# Patient Record
Sex: Female | Born: 2000
Health system: Southern US, Community
[De-identification: ages and names within clinical notes are randomized; demographics above are authoritative.]

## PROBLEM LIST (undated history)

## (undated) ENCOUNTER — Inpatient Hospital Stay: Payer: Self-pay

## (undated) HISTORY — PX: NO PAST SURGERIES: SHX2092

---
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
## 2020-01-11 ENCOUNTER — Ambulatory Visit: Payer: Self-pay | Admitting: Cardiovascular Disease

## 2020-01-11 NOTE — Progress Notes (Deleted)
    Cardiology Office Note   Date:  01/11/2020   ID:  Brenda Medina, DOB 2001-05-10, MRN [id number]  PCP:  [name], MD  Cardiologist:  Lorine Bears, MD   No chief complaint on file.     History of Present Illness: Brenda Medina is a 19 y.o. female who presents for ***    No past medical history on file.  No past surgical history on file.   Current Outpatient Medications  Medication Sig Dispense Refill  . Norgestimate-Ethinyl Estradiol Triphasic (ORTHO TRI-CYCLEN LO) 0.18/0.215/0.25 MG-25 MCG tab Take 1 tablet by mouth daily. 1 Package 11   No current facility-administered medications for this visit.    Allergies:   Patient has no known allergies.    Social History:  The patient  reports that she has never smoked. She has never used smokeless tobacco. She reports that she does not drink alcohol or use drugs.   Family History:  The patient's ***family history includes Cancer in her mother; Heart disease in her father.    ROS:  Please see the history of present illness.   Otherwise, review of systems are positive for {NONE DEFAULTED:18576::"none"}.   All other systems are reviewed and negative.    PHYSICAL EXAM: VS:  There were no vitals taken for this visit. , BMI There is no height or weight on file to calculate BMI. GEN: Well nourished, well developed, in no acute distress  HEENT: normal  Neck: no JVD, carotid bruits, or masses Cardiac: ***RRR; no murmurs, rubs, or gallops,no edema  Respiratory:  clear to auscultation bilaterally, normal work of breathing GI: soft, nontender, nondistended, + BS MS: no deformity or atrophy  Skin: warm and dry, no rash Neuro:  Strength and sensation are intact Psych: euthymic mood, full affect   EKG:  EKG {ACTION; IS/IS WER:15400867} ordered today. The ekg ordered today demonstrates ***   Recent Labs: No results found for requested labs within last 8760 hours.    Lipid Panel No results found for: CHOL, TRIG,  HDL, CHOLHDL, VLDL, LDLCALC, LDLDIRECT    Wt Readings from Last 3 Encounters:  06/27/19 126 lb (57.2 kg) (52 %, Z= 0.05)*   * Growth percentiles are based on CDC (Girls, 2-20 Years) data.      Other studies Reviewed: Additional studies/ records that were reviewed today include: ***. Review of the above records demonstrates: ***  No flowsheet data found.    ASSESSMENT AND PLAN:  1.  ***    Disposition:   FU with *** in {gen number 6-19:509326} {Days to years:10300}  Signed,  Lorine Bears, MD  01/11/2020 8:24 AM    [facility]

## 2020-01-12 ENCOUNTER — Encounter: Payer: Self-pay | Admitting: Cardiovascular Disease

## 2020-01-12 DIAGNOSIS — Z20828 Contact with and (suspected) exposure to other viral communicable diseases: Secondary | ICD-10-CM | POA: Diagnosis not present

## 2020-01-16 DIAGNOSIS — Z20828 Contact with and (suspected) exposure to other viral communicable diseases: Secondary | ICD-10-CM | POA: Diagnosis not present

## 2020-01-16 DIAGNOSIS — Z03818 Encounter for observation for suspected exposure to other biological agents ruled out: Secondary | ICD-10-CM | POA: Diagnosis not present

## 2020-03-28 ENCOUNTER — Other Ambulatory Visit: Payer: Self-pay | Admitting: Family Medicine

## 2020-03-28 DIAGNOSIS — L700 Acne vulgaris: Secondary | ICD-10-CM

## 2020-03-28 DIAGNOSIS — Z30011 Encounter for initial prescription of contraceptive pills: Secondary | ICD-10-CM

## 2020-04-19 ENCOUNTER — Ambulatory Visit (INDEPENDENT_AMBULATORY_CARE_PROVIDER_SITE_OTHER): Payer: BC Managed Care – PPO | Admitting: Family Medicine

## 2020-04-19 ENCOUNTER — Encounter: Payer: Self-pay | Admitting: Family Medicine

## 2020-04-19 VITALS — BP 110/70 | HR 80 | Ht 66.0 in | Wt 128.0 lb

## 2020-04-19 DIAGNOSIS — N926 Irregular menstruation, unspecified: Secondary | ICD-10-CM

## 2020-04-19 LAB — POCT URINALYSIS DIPSTICK
Bilirubin, UA: NEGATIVE
Glucose, UA: NEGATIVE
Ketones, UA: NEGATIVE
Leukocytes, UA: NEGATIVE
Nitrite, UA: NEGATIVE
Protein, UA: NEGATIVE
Spec Grav, UA: 1.02 (ref 1.010–1.025)
Urobilinogen, UA: 0.2 E.U./dL
pH, UA: 5 (ref 5.0–8.0)

## 2020-04-19 LAB — POCT URINE PREGNANCY: Preg Test, Ur: NEGATIVE

## 2020-04-19 NOTE — Progress Notes (Signed)
Date:  04/19/2020   Name:  Brenda Medina   DOB:  [birth year]   MRN:  [id number]   Chief Complaint: Menstrual Problem (referral to obgyn) and Possible Pregnancy (to rule out)  Possible Pregnancy This is a new problem. The current episode started 1 to 4 weeks ago. The problem occurs daily. The problem has been waxing and waning. Pertinent negatives include no abdominal pain, anorexia, arthralgias, change in bowel habit, chest pain, chills, congestion, coughing, diaphoresis, fatigue, fever, headaches, joint swelling, myalgias, nausea, neck pain, numbness, rash, sore throat, swollen glands, urinary symptoms, vertigo, visual change, vomiting or weakness. Nothing aggravates the symptoms.    No results found for: CREATININE, BUN, NA, K, CL, CO2 No results found for: CHOL, HDL, LDLCALC, LDLDIRECT, TRIG, CHOLHDL No results found for: TSH No results found for: HGBA1C No results found for: WBC, HGB, HCT, MCV, PLT No results found for: ALT, AST, GGT, ALKPHOS, BILITOT   Review of Systems  Constitutional: Negative.  Negative for chills, diaphoresis, fatigue, fever and unexpected weight change.  HENT: Negative for congestion, ear discharge, ear pain, rhinorrhea, sinus pressure, sneezing and sore throat.   Eyes: Negative for photophobia, pain, discharge, redness and itching.  Respiratory: Negative for cough, shortness of breath, wheezing and stridor.   Cardiovascular: Negative for chest pain.  Gastrointestinal: Negative for abdominal pain, anorexia, blood in stool, change in bowel habit, constipation, diarrhea, nausea and vomiting.  Endocrine: Negative for cold intolerance, heat intolerance, polydipsia, polyphagia and polyuria.  Genitourinary: Negative for dysuria, flank pain, frequency, hematuria, menstrual problem, pelvic pain, urgency, vaginal bleeding and vaginal discharge.  Musculoskeletal: Negative for arthralgias, back pain, joint swelling, myalgias and neck pain.  Skin: Negative for rash.    Allergic/Immunologic: Negative for environmental allergies and food allergies.  Neurological: Negative for dizziness, vertigo, weakness, light-headedness, numbness and headaches.  Hematological: Negative for adenopathy. Does not bruise/bleed easily.  Psychiatric/Behavioral: Negative for dysphoric mood. The patient is not nervous/anxious.     There are no problems to display for this patient.   No Known Allergies  No past surgical history on file.  Social History   Tobacco Use  . Smoking status: Never Smoker  . Smokeless tobacco: Never Used  Substance Use Topics  . Alcohol use: Never  . Drug use: Never     Medication list has been reviewed and updated.  Current Meds  Medication Sig  . TRI-LO-MARZIA 0.18/0.215/0.25 MG-25 MCG tab TAKE 1 TABLET BY MOUTH EVERY DAY    PHQ 2/9 Scores 04/19/2020 06/27/2019  PHQ - 2 Score 0 0  PHQ- 9 Score 0 -    BP Readings from Last 3 Encounters:  04/19/20 110/70  06/27/19 100/62    Physical Exam Vitals and nursing note reviewed.  Constitutional:      Appearance: She is well-developed.  HENT:     Head: Normocephalic.     Right Ear: Tympanic membrane, ear canal and external ear normal.     Left Ear: Tympanic membrane, ear canal and external ear normal.     Nose: Nose normal.     Mouth/Throat:     Mouth: Mucous membranes are moist.  Eyes:     General: Lids are everted, no foreign bodies appreciated. No scleral icterus.       Left eye: No foreign body or hordeolum.     Conjunctiva/sclera: Conjunctivae normal.     Right eye: Right conjunctiva is not injected.     Left eye: Left conjunctiva is not injected.  Pupils: Pupils are equal, round, and reactive to light.  Neck:     Thyroid: No thyromegaly.     Vascular: No JVD.     Trachea: No tracheal deviation.  Cardiovascular:     Rate and Rhythm: Normal rate and regular rhythm.     Heart sounds: Normal heart sounds. No murmur. No friction rub. No gallop.   Pulmonary:     Effort:  Pulmonary effort is normal. No respiratory distress.     Breath sounds: Normal breath sounds. No wheezing, rhonchi or rales.  Abdominal:     General: Bowel sounds are normal.     Palpations: Abdomen is soft. There is no mass.     Tenderness: There is no abdominal tenderness. There is no guarding or rebound.     Hernia: No hernia is present.  Musculoskeletal:        General: No tenderness. Normal range of motion.     Cervical back: Normal range of motion and neck supple.  Lymphadenopathy:     Cervical: No cervical adenopathy.  Skin:    General: Skin is warm.     Findings: No rash.  Neurological:     Mental Status: She is alert and oriented to person, place, and time.     Cranial Nerves: No cranial nerve deficit.     Motor: No weakness.     Deep Tendon Reflexes: Reflexes normal.  Psychiatric:        Mood and Affect: Mood is not anxious or depressed.     Wt Readings from Last 3 Encounters:  04/19/20 128 lb (58.1 kg) (52 %, Z= 0.04)*  06/27/19 126 lb (57.2 kg) (52 %, Z= 0.05)*   * Growth percentiles are based on CDC (Girls, 2-20 Years) data.    BP 110/70   Pulse 80   Ht 5\' 6"  (1.676 m)   Wt 128 lb (58.1 kg)   LMP 04/15/2020 (Approximate) Comment: irregular/ spotting  BMI 20.66 kg/m   Assessment and Plan:  1. Irregular periods Patient is currently on birth control with OCP.  Patient had a delay of one of her periods of about 2 weeks after which she did pass more mucus and blood than usual.  There was no tissue that was noted.  Patient was concerned about the possibility of pregnancy and the pregnancy test was done and that was negative.  Patient desires to have GYN follow-up for maintenance and discussion about possibility of cessation of birth control for pregnancy concerns.  Referral was made to Dr. 04/17/2020 for evaluation and further treatment. - Ambulatory referral to Obstetrics / Gynecology

## 2020-04-21 ENCOUNTER — Other Ambulatory Visit: Payer: Self-pay | Admitting: Family Medicine

## 2020-04-21 DIAGNOSIS — Z30011 Encounter for initial prescription of contraceptive pills: Secondary | ICD-10-CM

## 2020-04-21 DIAGNOSIS — L700 Acne vulgaris: Secondary | ICD-10-CM

## 2020-04-21 NOTE — Telephone Encounter (Signed)
Requested medication (s) are due for refill today: yes  Requested medication (s) are on the active medication list: yes  Last refill:  03/28/20  Future visit scheduled: with OB/GYN tomorrow  Notes to clinic:  Patient desires to have GYN follow-up for maintenance and discussion about possibility of cessation of birth control for pregnancy concerns.  Referral was made to Dr. Joan Flores for evaluation and further treatment. - Ambulatory referral to Obstetrics / Gynecology    Requested Prescriptions  Pending Prescriptions Disp Refills   TRI-LO-MARZIA 0.18/0.215/0.25 MG-25 MCG tab [Pharmacy Med Name: TRI-LO-MARZIA TABLET] 28 tablet 0    Sig: TAKE 1 TABLET BY MOUTH EVERY DAY      OB/GYN:  Contraceptives Passed - 04/21/2020 11:31 AM      Passed - Last BP in normal range    BP Readings from Last 1 Encounters:  04/19/20 110/70          Passed - Valid encounter within last 12 months    Recent Outpatient Visits           2 days ago Irregular periods   [facility] [name], MD   9 months ago Establishing care with new doctor, encounter for   [facility] [name], MD

## 2020-05-16 ENCOUNTER — Other Ambulatory Visit: Payer: Self-pay | Admitting: Family Medicine

## 2020-05-16 DIAGNOSIS — L700 Acne vulgaris: Secondary | ICD-10-CM

## 2020-05-16 DIAGNOSIS — Z30011 Encounter for initial prescription of contraceptive pills: Secondary | ICD-10-CM

## 2020-05-30 ENCOUNTER — Other Ambulatory Visit: Payer: Self-pay | Admitting: Family Medicine

## 2020-05-30 DIAGNOSIS — L700 Acne vulgaris: Secondary | ICD-10-CM

## 2020-05-30 DIAGNOSIS — Z30011 Encounter for initial prescription of contraceptive pills: Secondary | ICD-10-CM

## 2020-08-31 ENCOUNTER — Other Ambulatory Visit: Payer: Self-pay | Admitting: Family Medicine

## 2020-08-31 DIAGNOSIS — Z30011 Encounter for initial prescription of contraceptive pills: Secondary | ICD-10-CM

## 2020-08-31 DIAGNOSIS — L700 Acne vulgaris: Secondary | ICD-10-CM

## 2020-08-31 NOTE — Telephone Encounter (Signed)
Requested Prescriptions  Pending Prescriptions Disp Refills   TRI-LO-MARZIA 0.18/0.215/0.25 MG-25 MCG tab [Pharmacy Med Name: TRI-LO-MARZIA TABLET] 28 tablet 2    Sig: TAKE 1 TABLET BY MOUTH EVERY DAY     OB/GYN:  Contraceptives Passed - 08/31/2020  8:56 AM      Passed - Last BP in normal range    BP Readings from Last 1 Encounters:  04/19/20 110/70         Passed - Valid encounter within last 12 months    Recent Outpatient Visits          4 months ago Irregular periods   [facility] [name], MD   1 year ago Establishing care with new doctor, encounter for   [facility] [name], MD

## 2020-11-08 ENCOUNTER — Other Ambulatory Visit: Payer: Self-pay | Admitting: Family Medicine

## 2020-11-08 DIAGNOSIS — Z30011 Encounter for initial prescription of contraceptive pills: Secondary | ICD-10-CM

## 2020-11-08 DIAGNOSIS — L700 Acne vulgaris: Secondary | ICD-10-CM

## 2021-02-12 ENCOUNTER — Other Ambulatory Visit: Payer: Self-pay | Admitting: Family Medicine

## 2021-02-12 DIAGNOSIS — Z30011 Encounter for initial prescription of contraceptive pills: Secondary | ICD-10-CM

## 2021-02-12 DIAGNOSIS — L700 Acne vulgaris: Secondary | ICD-10-CM

## 2021-04-21 ENCOUNTER — Other Ambulatory Visit: Payer: Self-pay

## 2021-04-21 ENCOUNTER — Telehealth: Payer: Self-pay | Admitting: Family Medicine

## 2021-04-21 DIAGNOSIS — Z30011 Encounter for initial prescription of contraceptive pills: Secondary | ICD-10-CM

## 2021-04-21 DIAGNOSIS — L700 Acne vulgaris: Secondary | ICD-10-CM

## 2021-04-21 NOTE — Telephone Encounter (Signed)
Have not seen pt since April 2021- needs appt this week or next

## 2021-04-21 NOTE — Telephone Encounter (Signed)
Pt calling stating that she tried to have her birth control, tri lo Annia Belt, due to her insurance. She states that the pharmacy is refusing to give it to her. Please advise.      [id number] - [place], [place] - [address]  [address] [place] [address]  Phone: [phone] Fax: [phone]  Hours: Not open 24 hours

## 2021-04-21 NOTE — Telephone Encounter (Signed)
Patient is wet up for next Friday the 13th for med refill

## 2021-05-02 ENCOUNTER — Encounter: Payer: Self-pay | Admitting: Family Medicine

## 2021-05-02 ENCOUNTER — Other Ambulatory Visit: Payer: Self-pay

## 2021-05-02 ENCOUNTER — Ambulatory Visit (INDEPENDENT_AMBULATORY_CARE_PROVIDER_SITE_OTHER): Payer: BC Managed Care – PPO | Admitting: Family Medicine

## 2021-05-02 VITALS — BP 120/62 | HR 80 | Ht 66.0 in | Wt 131.0 lb

## 2021-05-02 DIAGNOSIS — Z3041 Encounter for surveillance of contraceptive pills: Secondary | ICD-10-CM

## 2021-05-02 DIAGNOSIS — N921 Excessive and frequent menstruation with irregular cycle: Secondary | ICD-10-CM

## 2021-05-02 DIAGNOSIS — L700 Acne vulgaris: Secondary | ICD-10-CM | POA: Diagnosis not present

## 2021-05-02 MED ORDER — NORGESTIM-ETH ESTRAD TRIPHASIC 0.18/0.215/0.25 MG-25 MCG PO TABS: 1.0000 | ORAL_TABLET | Freq: Every day | ORAL | 3 refills | Status: DC

## 2021-05-02 NOTE — Patient Instructions (Signed)
Oral Contraception Information Oral contraceptive pills (OCPs) are medicines taken by mouth to prevent pregnancy. They work by:  Preventing the ovaries from releasing eggs.  Thickening mucus in the lower part of the uterus (cervix). This prevents sperm from entering the uterus.  Thinning the lining of the uterus (endometrium). This prevents a fertilized egg from attaching to the endometrium. OCPs are highly effective when taken exactly as prescribed. However, OCPs do not prevent STIs (sexually transmitted infections). Using condoms while on an OCP can help prevent STIs. What happens before starting OCPs? Before you start taking OCPs:  You may have a physical exam, blood test, and Pap test.  Your health care provider will make sure you are a good candidate for oral contraception. OCPs are not a good option for certain women, such as: ? Women who smoke and are older than age 35. ? Women who have or have had certain conditions, such as:  A history of high blood pressure.  Deep vein thrombosis.  Pulmonary embolism.  Stroke.  Cardiovascular disease.  Peripheral vascular disease. Ask your health care provider about the possible side effects of the OCP you may be prescribed. Be aware that it can take 2-3 months for your body to adjust to changes in hormone levels. Types of oral contraception Birth control pills contain the hormones estrogen and progestin (synthetic progesterone) or progestin only. The combination pill This type of pill contains estrogen and progestin hormones.  Conventional contraception pills come in packs of 21 or 28 pills. ? Some packs with 28-day pills contain estrogen and progestin for the first 21-24 days. Hormone-free tablets, called placebos, are taken for the final 4-7 days. You should have menstrual bleeding during the time you take the placebos. ? In packs with 21 tablets, you take no pills for 7 days. Menstrual bleeding occurs during these days. (Some people  prefer taking a pill for 28 days to help establish a routine).  Extended-interval contraception pills come in packs of 91 pills. The first 84 tablets have both estrogen and progestin. The last 7 pills are placebos. Menstrual bleeding occurs during the placebo days. With this schedule, menstrual bleeding happens once every 3 months.  Continuous contraception pills come in packs of 28 pills. All pills in the pack contain estrogen and progestin. With this schedule, regular menstrual bleeding does not happen, but there may be spotting or irregular bleeding. Progestin-only pills This type of pill is often called the mini-pill and contains the progestin hormone only. It comes in packs of 28 pills. In some packs, the last 4 pills are placebos. The pill must be taken at the same time every day. This is very important to prevent pregnancy. Menstrual bleeding may not be regular or predictable.   What are the advantages? Oral contraception provides reliable and continuous contraception if taken as directed. It may treat or decrease symptoms of:  Menstrual period cramps.  Irregular menstrual cycle or bleeding.  Heavy menstrual flow.  Abnormal uterine bleeding.  Acne, depending on the type of pill.  Polycystic ovarian syndrome (POS).  Endometriosis.  Iron deficiency anemia.  Premenstrual symptoms, including severe irritability, depression, or anxiety. It also may:  Reduce the risk of endometrial and ovarian cancer.  Be used as emergency contraception.  Prevent ectopic pregnancies and infections of the fallopian tubes. What can make OCPs less effective? OCPs may be less effective if:  You forget to take the pill every day. For progestin-only pills, it is especially important to take the pill at the   same time each day. Even taking it 3 hours late can increase the risk of pregnancy.  You have a stomach or intestinal disease that reduces your body's ability to absorb the pill.  You take OCPs  with other medicines that make OCPs less effective, such as antibiotics, certain HIV medicines, and some seizure medicines.  You take expired OCPs.  You forget to restart the pill after 7 days of not taking it. This refers to the packs of 21 pills. What are the side effects and risks? OCPs can sometimes cause side effects, such as:  Headache.  Depression.  Trouble sleeping.  Nausea and vomiting.  Breast tenderness.  Irregular bleeding or spotting during the first several months.  Bloating or fluid retention.  Increase in blood pressure. Combination pills may slightly increase the risk of:  Blood clots.  Heart attack.  Stroke. Follow these instructions at home: Follow instructions from your health care provider about how to start taking your first cycle of OCPs. Depending on when you start the pill, you may need to use a backup form of birth control, such as condoms, during the first week. Make sure you know what steps to take if you forget to take the pill. Summary  Oral contraceptive pills (OCPs) are medicines taken by mouth to prevent pregnancy. They are highly effective when taken exactly as prescribed.  OCPs contain a combination of the hormones estrogen and progestin (synthetic progesterone) or progestin only.  Before you start taking the pill, you may have a physical exam, blood test, and Pap test. Your health care provider will make sure you are a good candidate for oral contraception.  The combination pill may come in a 21-day pack, a 28-day pack, or a 91-day pack. Progestin-only pills come in packs of 28 pills.  OCPs can sometimes cause side effects, such as headache, nausea, breast tenderness, or irregular bleeding. This information is not intended to replace advice given to you by your health care provider. Make sure you discuss any questions you have with your health care provider. Document Revised: 09/06/2020 Document Reviewed: 08/15/2020 Elsevier Patient  Education  2021 [employer]vier Inc.  

## 2021-05-02 NOTE — Progress Notes (Signed)
Date:  05/02/2021   Name:  Brenda Medina   DOB:  2001-07-08   MRN:  [id number]   Chief Complaint: Contraception  Patient is a 20 year old female who presents for a comprehensive physical exam. The patient reports the following problems: none. Health maintenance has been reviewed up to date.   No results found for: CREATININE, BUN, NA, K, CL, CO2 No results found for: CHOL, HDL, LDLCALC, LDLDIRECT, TRIG, CHOLHDL No results found for: TSH No results found for: HGBA1C No results found for: WBC, HGB, HCT, MCV, PLT No results found for: ALT, AST, GGT, ALKPHOS, BILITOT   Review of Systems  Constitutional: Negative.  Negative for chills, fatigue, fever and unexpected weight change.  HENT: Negative for congestion, ear discharge, ear pain, rhinorrhea, sinus pressure, sneezing and sore throat.   Eyes: Negative for photophobia, pain, discharge, redness and itching.  Respiratory: Negative for cough, shortness of breath, wheezing and stridor.   Gastrointestinal: Negative for abdominal pain, blood in stool, constipation, diarrhea, nausea and vomiting.  Endocrine: Negative for cold intolerance, heat intolerance, polydipsia, polyphagia and polyuria.  Genitourinary: Negative for dysuria, flank pain, frequency, hematuria, menstrual problem, pelvic pain, urgency, vaginal bleeding and vaginal discharge.  Musculoskeletal: Negative for arthralgias, back pain and myalgias.  Skin: Negative for rash.  Allergic/Immunologic: Negative for environmental allergies and food allergies.  Neurological: Negative for dizziness, weakness, light-headedness, numbness and headaches.  Hematological: Negative for adenopathy. Does not bruise/bleed easily.  Psychiatric/Behavioral: Negative for dysphoric mood. The patient is not nervous/anxious.     There are no problems to display for this patient.   No Known Allergies  No past surgical history on file.  Social History   Tobacco Use  . Smoking status: Never Smoker   . Smokeless tobacco: Never Used  Substance Use Topics  . Alcohol use: Never  . Drug use: Never     Medication list has been reviewed and updated.  Current Meds  Medication Sig  . TRI-LO-MARZIA 0.18/0.215/0.25 MG-25 MCG tab TAKE 1 TABLET BY MOUTH EVERY DAY    PHQ 2/9 Scores 05/02/2021 04/19/2020 06/27/2019  PHQ - 2 Score 0 0 0  PHQ- 9 Score 0 0 -    GAD 7 : Generalized Anxiety Score 05/02/2021 04/19/2020  Nervous, Anxious, on Edge 0 0  Control/stop worrying 0 0  Worry too much - different things 0 0  Trouble relaxing 0 0  Restless 0 0  Easily annoyed or irritable 0 0  Afraid - awful might happen 0 0  Total GAD 7 Score 0 0    BP Readings from Last 3 Encounters:  05/02/21 120/62  04/19/20 110/70  06/27/19 100/62    Physical Exam Vitals and nursing note reviewed.  Constitutional:      Appearance: She is well-developed.  HENT:     Head: Normocephalic.     Right Ear: Tympanic membrane, ear canal and external ear normal. There is no impacted cerumen.     Left Ear: Tympanic membrane, ear canal and external ear normal. There is no impacted cerumen.  Eyes:     General: Lids are everted, no foreign bodies appreciated. No scleral icterus.       Left eye: No foreign body or hordeolum.     Conjunctiva/sclera: Conjunctivae normal.     Right eye: Right conjunctiva is not injected.     Left eye: Left conjunctiva is not injected.     Pupils: Pupils are equal, round, and reactive to light.  Neck:  Thyroid: No thyromegaly.     Vascular: No JVD.     Trachea: No tracheal deviation.  Cardiovascular:     Rate and Rhythm: Normal rate and regular rhythm.     Heart sounds: Normal heart sounds. No murmur heard. No friction rub. No gallop.   Pulmonary:     Effort: Pulmonary effort is normal. No respiratory distress.     Breath sounds: Normal breath sounds. No wheezing or rales.  Abdominal:     General: Bowel sounds are normal.     Palpations: Abdomen is soft. There is no mass.      Tenderness: There is no abdominal tenderness. There is no guarding or rebound.  Musculoskeletal:        General: No tenderness. Normal range of motion.     Cervical back: Normal range of motion and neck supple.  Lymphadenopathy:     Cervical: No cervical adenopathy.  Skin:    General: Skin is warm.     Findings: No rash.  Neurological:     Mental Status: She is alert and oriented to person, place, and time.     Cranial Nerves: No cranial nerve deficit.     Deep Tendon Reflexes: Reflexes normal.  Psychiatric:        Mood and Affect: Mood is not anxious or depressed.     Wt Readings from Last 3 Encounters:  05/02/21 131 lb (59.4 kg)  04/19/20 128 lb (58.1 kg) (52 %, Z= 0.04)*  06/27/19 126 lb (57.2 kg) (52 %, Z= 0.05)*   * Growth percentiles are based on CDC (Girls, 2-20 Years) data.    BP 120/62   Pulse 80   Ht 5\' 6"  (1.676 m)   Wt 131 lb (59.4 kg)   LMP 04/30/2021 (Approximate)   BMI 21.14 kg/m   Assessment and Plan: 1. Oral contraceptive pill surveillance .  Persistent.  Stable.  Patient has tolerated and would like to continue on current oral contraceptive pill selection.  Patient is doing well with no contraindications or concerns.  It was discussed with patient concerning the side effects and risks of OCPs and patient understands.  Patient prefers to continue on current dosing and patient will return in 1 year for likely Pap and refills. - Norgestimate-Ethinyl Estradiol Triphasic (TRI-LO-MARZIA) 0.18/0.215/0.25 MG-25 MCG tab; Take 1 tablet by mouth daily.  Dispense: 84 tablet; Refill: 3  2. Menorrhagia with irregular cycle Chronic.  Stable.  Corrected with OCPs.  Patient had previous history of menorrhagia with irregular cycles and OCPs have reduced bleeding to an acceptable amount and timing is monthly. - Norgestimate-Ethinyl Estradiol Triphasic (TRI-LO-MARZIA) 0.18/0.215/0.25 MG-25 MCG tab; Take 1 tablet by mouth daily.  Dispense: 84 tablet; Refill: 3  3. Acne  vulgaris Chronic.  Controlled.  Stable.  Previous history of acne vulgaris and patient has done well on current dosing of OCP for control of acneiform rash. - Norgestimate-Ethinyl Estradiol Triphasic (TRI-LO-MARZIA) 0.18/0.215/0.25 MG-25 MCG tab; Take 1 tablet by mouth daily.  Dispense: 84 tablet; Refill: 3

## 2021-05-05 ENCOUNTER — Other Ambulatory Visit: Payer: Self-pay

## 2021-05-05 ENCOUNTER — Telehealth: Payer: Self-pay | Admitting: Family Medicine

## 2021-05-05 DIAGNOSIS — L700 Acne vulgaris: Secondary | ICD-10-CM

## 2021-05-05 DIAGNOSIS — Z3041 Encounter for surveillance of contraceptive pills: Secondary | ICD-10-CM

## 2021-05-05 DIAGNOSIS — N921 Excessive and frequent menstruation with irregular cycle: Secondary | ICD-10-CM

## 2021-05-05 MED ORDER — NORGESTIM-ETH ESTRAD TRIPHASIC 0.18/0.215/0.25 MG-25 MCG PO TABS: 1.0000 | ORAL_TABLET | Freq: Every day | ORAL | 3 refills | Status: DC

## 2021-05-05 NOTE — Telephone Encounter (Signed)
Sent to [facility]

## 2021-05-05 NOTE — Telephone Encounter (Signed)
Pt called and is requesting to have her prescription, Tri-Lo-Marzia, sent to the [facility] pharmacy instead of [facility]. She states that her insurance is no longer covering medication through [facility]. Please advise.      [id number] [place], [place] - [address] AT [facility] [address] [place]  [address] [place] [address]  Phone: [phone] Fax: [phone]  Hours: Not open 24 hours

## 2021-09-27 ENCOUNTER — Emergency Department: Payer: BC Managed Care – PPO

## 2021-09-27 ENCOUNTER — Encounter: Payer: Self-pay | Admitting: Emergency Medicine

## 2021-09-27 ENCOUNTER — Other Ambulatory Visit: Payer: Self-pay

## 2021-09-27 ENCOUNTER — Emergency Department
Admission: EM | Admit: 2021-09-27 | Discharge: 2021-09-27 | Disposition: A | Discharge status: Home / Self Care | Payer: BC Managed Care – PPO | Attending: Emergency Medicine | Admitting: Emergency Medicine

## 2021-09-27 DIAGNOSIS — S3993XA Unspecified injury of pelvis, initial encounter: Secondary | ICD-10-CM | POA: Diagnosis not present

## 2021-09-27 DIAGNOSIS — M545 Low back pain, unspecified: Secondary | ICD-10-CM | POA: Diagnosis not present

## 2021-09-27 DIAGNOSIS — M25552 Pain in left hip: Secondary | ICD-10-CM | POA: Diagnosis not present

## 2021-09-27 DIAGNOSIS — S300XXA Contusion of lower back and pelvis, initial encounter: Secondary | ICD-10-CM | POA: Diagnosis not present

## 2021-09-27 DIAGNOSIS — M533 Sacrococcygeal disorders, not elsewhere classified: Secondary | ICD-10-CM | POA: Diagnosis not present

## 2021-09-27 LAB — POC URINE PREG, ED: Preg Test, Ur: NEGATIVE

## 2021-09-27 MED ORDER — MELOXICAM 15 MG PO TABS: 15.0000 mg | ORAL_TABLET | Freq: Every day | ORAL | 0 refills | Status: DC

## 2021-09-27 MED ORDER — ACETAMINOPHEN 325 MG PO TABS
650.0000 mg | ORAL_TABLET | Freq: Once | ORAL | Status: AC
  Administered 2021-09-27: 650 mg via ORAL
  Filled 2021-09-27: qty 2

## 2021-09-27 MED ORDER — METHOCARBAMOL 500 MG PO TABS: 500.0000 mg | ORAL_TABLET | Freq: Four times a day (QID) | ORAL | 0 refills | Status: DC

## 2021-09-27 MED ORDER — IBUPROFEN 600 MG PO TABS
600.0000 mg | ORAL_TABLET | Freq: Once | ORAL | Status: AC
  Administered 2021-09-27: 600 mg via ORAL
  Filled 2021-09-27: qty 1

## 2021-09-27 NOTE — ED Triage Notes (Addendum)
Pt via POV from home. Pt was riding her horse on the side of the road and she fell off landing on her L side. Pt c/o L hip, buttock, and sacral pain. Denies pain anywhere else. Pt is A&OX4 and NAD. Ambulatory to triage

## 2021-09-27 NOTE — ED Provider Notes (Signed)
[facility] Emergency Department Provider Note  ____________________________________________  Time seen: Approximately 2:39 PM  I have reviewed the triage vital signs and the nursing notes.   HISTORY  Chief Complaint Fall    HPI Brenda Medina is a 20 y.o. female who presents the emergency department complaining of left hip and tailbone pain.  Patient states that the pain is primarily in the region of the buttocks extending into the sacral region.  Patient did fall off of a horse but did not hit her head or lose consciousness.  She denies any back, chest, abdominal pain.  She denies any headache, neck pain.  No medications prior to arrival.  Patient is still ambulating at this time.       History reviewed. No pertinent past medical history.  There are no problems to display for this patient.   History reviewed. No pertinent surgical history.  Prior to Admission medications   Medication Sig Start Date End Date Taking? Authorizing Provider  meloxicam (MOBIC) 15 MG tablet Take 1 tablet (15 mg total) by mouth daily. 09/27/21  Yes Kadeja Granada, Delorise Royals, PA-C  methocarbamol (ROBAXIN) 500 MG tablet Take 1 tablet (500 mg total) by mouth 4 (four) times daily. 09/27/21  Yes Amia Rynders, Delorise Royals, PA-C  Norgestimate-Ethinyl Estradiol Triphasic (TRI-LO-MARZIA) 0.18/0.215/0.25 MG-25 MCG tab Take 1 tablet by mouth daily. 05/05/21   [name], MD    Allergies Patient has no known allergies.  Family History  Problem Relation Age of Onset   Cancer Mother    Heart disease Father     Social History Social History   Tobacco Use   Smoking status: Never   Smokeless tobacco: Never  Substance Use Topics   Alcohol use: Never   Drug use: Never     Review of Systems  Constitutional: No fever/chills Eyes: No visual changes. No discharge ENT: No upper respiratory complaints. Cardiovascular: no chest pain. Respiratory: no cough. No SOB. Gastrointestinal: No  abdominal pain.  No nausea, no vomiting.  No diarrhea.  No constipation. Musculoskeletal: Positive for left hip, sacral pain after a fall off of a horse Skin: Negative for rash, abrasions, lacerations, ecchymosis. Neurological: Negative for headaches, focal weakness or numbness.  10 System ROS otherwise negative.  ____________________________________________   PHYSICAL EXAM:  VITAL SIGNS: ED Triage Vitals  Enc Vitals Group     BP 09/27/21 1359 124/67     Pulse Rate 09/27/21 1359 93     Resp 09/27/21 1359 18     Temp 09/27/21 1359 98.1 F (36.7 C)     Temp Source 09/27/21 1359 Oral     SpO2 09/27/21 1359 100 %     Weight 09/27/21 1400 130 lb (59 kg)     Height 09/27/21 1400 5\' 8"  (1.727 m)     Head Circumference --      Peak Flow --      Pain Score 09/27/21 1359 8     Pain Loc --      Pain Edu? --      Excl. in GC? --      Constitutional: Alert and oriented. Well appearing and in no acute distress. Eyes: Conjunctivae are normal. PERRL. EOMI. Head: Atraumatic. ENT:      Ears:       Nose: No congestion/rhinnorhea.      Mouth/Throat: Mucous membranes are moist.  Neck: No stridor.    Cardiovascular: Normal rate, regular rhythm. Normal S1 and S2.  Good peripheral circulation. Respiratory: Normal respiratory  effort without tachypnea or retractions. Lungs CTAB. Good air entry to the bases with no decreased or absent breath sounds. Gastrointestinal: Bowel sounds 4 quadrants. Soft and nontender to palpation. No guarding or rigidity. No palpable masses. No distention. No CVA tenderness. Musculoskeletal: Full range of motion to all extremities. No gross deformities appreciated.  No obvious deformities to the hip, leg or lumbar spine.  Patient is tender over the sacral spine extending into the SI joint.  No palpable abnormalities in this region.  Sensation and pulses intact bilateral lower extremities. Neurologic:  Normal speech and language. No gross focal neurologic deficits are  appreciated.  Skin:  Skin is warm, dry and intact. No rash noted. Psychiatric: Mood and affect are normal. Speech and behavior are normal. Patient exhibits appropriate insight and judgement.   ____________________________________________   LABS (all labs ordered are listed, but only abnormal results are displayed)  Labs Reviewed  POC URINE PREG, ED   ____________________________________________  EKG   ____________________________________________  RADIOLOGY I personally viewed and evaluated these images as part of my medical decision making, as well as reviewing the written report by the radiologist.  ED Provider Interpretation: No acute traumatic findings.  Remote injury identified on lumbar spine  DG Lumbar Spine 2-3 Views  Result Date: 09/27/2021 CLINICAL DATA:  fall off horse, L hip and sacral pain EXAM: LUMBAR SPINE - 2-3 VIEW COMPARISON:  None. FINDINGS: This report assumes 5 non rib-bearing lumbar vertebrae. Lumbar vertebral body heights are preserved, with no acute fracture. Well corticated limbus vertebra in the anterior inferior L4 vertebral body. Mild loss of disc height at L2-3 without significant degenerative changes. Otherwise preserved lumbar discs. No spondylolisthesis. No appreciable facet arthropathy. No aggressive appearing focal osseous lesions. IMPRESSION: 1. No acute lumbar spine fracture or spondylolisthesis. 2. Mild loss of disc height at L2-3. 3. Well corticated limbus vertebrae in the anterior inferior L4 vertebral body compatible with remote injury. Electronically Signed   By: Delbert Phenix M.D.   On: 09/27/2021 15:13   DG Sacrum/Coccyx  Result Date: 09/27/2021 CLINICAL DATA:  fall off horse, L hip and sacral pain EXAM: SACRUM AND COCCYX - 2+ VIEW COMPARISON:  None. FINDINGS: There is no evidence of fracture or other focal bone lesions. IMPRESSION: No sacrococcygeal fracture. Electronically Signed   By: Delbert Phenix M.D.   On: 09/27/2021 15:15   DG Hip Unilat  W or Wo Pelvis 2-3 Views Left  Result Date: 09/27/2021 CLINICAL DATA:  fall off horse, L hip and sacral pain EXAM: DG HIP (WITH OR WITHOUT PELVIS) 2-3V LEFT COMPARISON:  None. FINDINGS: No pelvic fracture or diastasis. No left hip fracture or dislocation. No suspicious focal osseous lesions. No significant left hip arthropathy. No radiopaque foreign bodies. IMPRESSION: No fracture.  No left hip malalignment. Electronically Signed   By: Delbert Phenix M.D.   On: 09/27/2021 15:16    ____________________________________________    PROCEDURES  Procedure(s) performed:    Procedures    Medications  acetaminophen (TYLENOL) tablet 650 mg (650 mg Oral Given 09/27/21 1449)  ibuprofen (ADVIL) tablet 600 mg (600 mg Oral Given 09/27/21 1449)     ____________________________________________   INITIAL IMPRESSION / ASSESSMENT AND PLAN / ED COURSE  Pertinent labs & imaging results that were available during my care of the patient were reviewed by me and considered in my medical decision making (see chart for details).  Review of the  [place] was performed in accordance of the [facility] prior to dispensing any controlled drugs.  Patient's diagnosis is consistent with fall of a horse, contusion of the coccyx.  Patient presented to the emergency department complaining of primarily tailbone pain after falling off a horse.  She did not hit her head or lose consciousness.  Imaging is reassuring with no acute traumatic findings.  Patient will be placed on anti-inflammatory muscle relaxer at home for symptom relief.  Follow-up primary care as needed..  Patient is given ED precautions to return to the ED for any worsening or new symptoms.     ____________________________________________  FINAL CLINICAL IMPRESSION(S) / ED DIAGNOSES  Final diagnoses:  Fall from horse, initial encounter  Contusion of coccyx, initial encounter      NEW MEDICATIONS STARTED DURING THIS VISIT:  ED Discharge  Orders          Ordered    meloxicam (MOBIC) 15 MG tablet  Daily        09/27/21 1544    methocarbamol (ROBAXIN) 500 MG tablet  4 times daily        09/27/21 1544                This chart was dictated using voice recognition software/Dragon. Despite best efforts to proofread, errors can occur which can change the meaning. Any change was purely unintentional.    Racheal Patches, PA-C 09/27/21 1544    Gilles Chiquito, MD 09/27/21 1850

## 2022-04-21 ENCOUNTER — Other Ambulatory Visit: Payer: Self-pay

## 2022-04-21 DIAGNOSIS — Z3041 Encounter for surveillance of contraceptive pills: Secondary | ICD-10-CM

## 2022-04-21 DIAGNOSIS — N921 Excessive and frequent menstruation with irregular cycle: Secondary | ICD-10-CM

## 2022-04-21 DIAGNOSIS — L700 Acne vulgaris: Secondary | ICD-10-CM

## 2022-04-21 MED ORDER — NORGESTIM-ETH ESTRAD TRIPHASIC 0.18/0.215/0.25 MG-25 MCG PO TABS
1.0000 | ORAL_TABLET | Freq: Every day | ORAL | 0 refills | Status: DC
Start: 2022-04-21 — End: 2022-05-19

## 2022-05-17 ENCOUNTER — Other Ambulatory Visit: Payer: Self-pay | Admitting: Family Medicine

## 2022-05-17 DIAGNOSIS — N921 Excessive and frequent menstruation with irregular cycle: Secondary | ICD-10-CM

## 2022-05-17 DIAGNOSIS — Z3041 Encounter for surveillance of contraceptive pills: Secondary | ICD-10-CM

## 2022-05-17 DIAGNOSIS — L700 Acne vulgaris: Secondary | ICD-10-CM

## 2022-06-02 ENCOUNTER — Encounter: Payer: Self-pay | Admitting: Family Medicine

## 2022-06-02 ENCOUNTER — Ambulatory Visit (INDEPENDENT_AMBULATORY_CARE_PROVIDER_SITE_OTHER): Payer: BC Managed Care – PPO | Admitting: Family Medicine

## 2022-06-02 VITALS — BP 120/64 | HR 64 | Ht 69.0 in | Wt 158.0 lb

## 2022-06-02 DIAGNOSIS — R5383 Other fatigue: Secondary | ICD-10-CM | POA: Diagnosis not present

## 2022-06-02 DIAGNOSIS — N921 Excessive and frequent menstruation with irregular cycle: Secondary | ICD-10-CM

## 2022-06-02 DIAGNOSIS — Z0000 Encounter for general adult medical examination without abnormal findings: Secondary | ICD-10-CM

## 2022-06-02 NOTE — Progress Notes (Signed)
Date:  06/02/2022   Name:  Brenda Medina   DOB:  June 17, 2001   MRN:  [id number]   Chief Complaint: Annual Exam (labs)  Patient is a 21 year old female who presents for a comprehensive physical exam. The patient reports the following problems: irregular/heavy periods. Health maintenance has been reviewed needs pap.      Thyroid Problem Presents for initial (for fatigue) visit. Symptoms include fatigue and menstrual problem. Patient reports no anxiety, cold intolerance, constipation, depressed mood, diarrhea, dry skin, hair loss, nail problem, palpitations, weight gain or weight loss.    No results found for: "NA", "K", "CO2", "GLUCOSE", "BUN", "CREATININE", "CALCIUM", "EGFR", "GFRNONAA" No results found for: "CHOL", "HDL", "LDLCALC", "LDLDIRECT", "TRIG", "CHOLHDL" No results found for: "TSH" No results found for: "HGBA1C" No results found for: "WBC", "HGB", "HCT", "MCV", "PLT" No results found for: "ALT", "AST", "GGT", "ALKPHOS", "BILITOT" No results found for: "25OHVITD2", "25OHVITD3", "VD25OH"   Review of Systems  Constitutional:  Positive for fatigue. Negative for chills, fever, weight gain and weight loss.  HENT:  Negative for drooling, ear discharge, ear pain and sore throat.   Respiratory:  Negative for cough, shortness of breath and wheezing.   Cardiovascular:  Negative for chest pain, palpitations and leg swelling.  Gastrointestinal:  Negative for abdominal pain, blood in stool, constipation, diarrhea and nausea.  Endocrine: Negative for cold intolerance and polydipsia.  Genitourinary:  Positive for menstrual problem. Negative for dysuria, frequency, hematuria, pelvic pain and urgency.  Musculoskeletal:  Negative for back pain, myalgias and neck pain.  Skin:  Negative for rash.  Allergic/Immunologic: Negative for environmental allergies.  Neurological:  Negative for dizziness and headaches.  Hematological:  Does not bruise/bleed easily.  Psychiatric/Behavioral:  Negative  for suicidal ideas. The patient is not nervous/anxious.     There are no problems to display for this patient.   No Known Allergies  No past surgical history on file.  Social History   Tobacco Use   Smoking status: Never   Smokeless tobacco: Never  Substance Use Topics   Alcohol use: Never   Drug use: Never     Medication list has been reviewed and updated.  Current Meds  Medication Sig   TRI-LO-MILI 0.18/0.215/0.25 MG-25 MCG tab TAKE 1 TABLET BY MOUTH DAILY   [DISCONTINUED] meloxicam (MOBIC) 15 MG tablet Take 1 tablet (15 mg total) by mouth daily.       06/02/2022   10:35 AM 05/02/2021   10:51 AM 04/19/2020    8:58 AM  GAD 7 : Generalized Anxiety Score  Nervous, Anxious, on Edge 0 0 0  Control/stop worrying 0 0 0  Worry too much - different things 0 0 0  Trouble relaxing 0 0 0  Restless 0 0 0  Easily annoyed or irritable 0 0 0  Afraid - awful might happen 0 0 0  Total GAD 7 Score 0 0 0  Anxiety Difficulty Not difficult at all         06/02/2022   10:35 AM  Depression screen PHQ 2/9  Decreased Interest 0  Down, Depressed, Hopeless 0  PHQ - 2 Score 0  Altered sleeping 0  Tired, decreased energy 0  Change in appetite 0  Feeling bad or failure about yourself  0  Trouble concentrating 0  Moving slowly or fidgety/restless 0  Suicidal thoughts 0  PHQ-9 Score 0  Difficult doing work/chores Not difficult at all    BP Readings from Last 3 Encounters:  06/02/22 120/64  09/27/21 124/67  05/02/21 120/62    Physical Exam Vitals and nursing note reviewed.  Constitutional:      Appearance: She is well-developed, well-groomed and normal weight.  HENT:     Head: Normocephalic.     Jaw: There is normal jaw occlusion.     Right Ear: Tympanic membrane, ear canal and external ear normal. There is no impacted cerumen.     Left Ear: Tympanic membrane, ear canal and external ear normal. There is no impacted cerumen.     Nose: Nose normal.     Right Turbinates: Not  enlarged.     Left Turbinates: Not enlarged.     Mouth/Throat:     Lips: Pink.     Mouth: Mucous membranes are moist.     Dentition: Normal dentition.     Tongue: No lesions.     Palate: No mass.     Pharynx: Oropharynx is clear. Uvula midline.  Eyes:     General: Lids are normal. Vision grossly intact. Gaze aligned appropriately. No scleral icterus.    Extraocular Movements: Extraocular movements intact.     Conjunctiva/sclera: Conjunctivae normal.     Right eye: Right conjunctiva is not injected.     Left eye: Left conjunctiva is not injected.     Pupils: Pupils are equal, round, and reactive to light.  Neck:     Thyroid: No thyroid mass, thyromegaly or thyroid tenderness.     Vascular: Normal carotid pulses. No carotid bruit, hepatojugular reflux or JVD.     Trachea: Trachea and phonation normal. No tracheal deviation.  Cardiovascular:     Rate and Rhythm: Normal rate and regular rhythm.     Pulses: Normal pulses.          Carotid pulses are 2+ on the right side and 2+ on the left side.      Radial pulses are 2+ on the right side and 2+ on the left side.     Heart sounds: S1 normal and S2 normal. Murmur heard.     Systolic murmur is present with a grade of 1/6.     No diastolic murmur is present.     No friction rub. No gallop. No S3 or S4 sounds.  Pulmonary:     Effort: Pulmonary effort is normal. No respiratory distress.     Breath sounds: Normal breath sounds. No decreased breath sounds, wheezing, rhonchi or rales.  Chest:     Chest wall: No mass.  Breasts:    Right: Normal. No swelling, bleeding, inverted nipple, mass, nipple discharge, skin change or tenderness.     Left: Normal. No swelling, bleeding, inverted nipple, mass, nipple discharge, skin change or tenderness.  Abdominal:     General: Bowel sounds are normal.     Palpations: Abdomen is soft. There is no hepatomegaly, splenomegaly or mass.     Tenderness: There is no abdominal tenderness. There is no guarding  or rebound.  Musculoskeletal:        General: No tenderness. Normal range of motion.     Cervical back: Full passive range of motion without pain, normal range of motion and neck supple.     Right lower leg: No edema.     Left lower leg: No edema.  Lymphadenopathy:     Head:     Right side of head: No submandibular or tonsillar adenopathy.     Left side of head: No submandibular or tonsillar adenopathy.     Cervical: No cervical adenopathy.  Right cervical: No superficial, deep or posterior cervical adenopathy.    Left cervical: No superficial, deep or posterior cervical adenopathy.     Upper Body:     Right upper body: No supraclavicular or axillary adenopathy.     Left upper body: No supraclavicular or axillary adenopathy.  Skin:    General: Skin is warm.     Findings: No rash.  Neurological:     General: No focal deficit present.     Mental Status: She is alert and oriented to person, place, and time.     Cranial Nerves: Cranial nerves 2-12 are intact. No cranial nerve deficit.     Deep Tendon Reflexes: Reflexes are normal and symmetric. Reflexes normal.  Psychiatric:        Mood and Affect: Mood is not anxious or depressed.     Wt Readings from Last 3 Encounters:  06/02/22 158 lb (71.7 kg)  09/27/21 130 lb (59 kg)  05/02/21 131 lb (59.4 kg)    BP 120/64   Pulse 64   Ht _0  (1.753 m)   Wt 158 lb (71.7 kg)   BMI 23.33 kg/m   Assessment and Plan: Patient's chart was reviewed for previous encounters most recent labs most recent imaging in [place]. TYLAR MERENDINO is a 21 y.o. female who presents today for her Complete Annual Exam. She feels well. She reports exercising . She reports she is sleeping well.    1. Annual physical exam Immunizations are reviewed and recommendations provided.   Age appropriate screening tests are discussed. Counseling given for risk factor reduction interventions.  No subjective/objective concerns noted during HPI, ROS, and physical  exam.  We will obtain CBC and renal function panel. - Ambulatory referral to Gynecology  2. Menorrhagia with irregular cycle Chronic.  Episodic.  Patient states despite being on birth control pills this is caused heavy bleeding and irregularity to her periods patient desires to stop birth control on her own and I have cautioned her but she would like to try to do without with the understanding that she will be seen gynecology for consultation of other means of contraception. - CBC with Differential/Platelet  3. Fatigue, unspecified type Chronic.  Patient has had persistence of fatigue and has a pallor of her conjunctiva.  We will check CBC TSH and renal function for current status. - Renal Function Panel - CBC with Differential/Platelet - TSH

## 2022-06-03 LAB — CBC WITH DIFFERENTIAL/PLATELET
Basophils Absolute: 0 10*3/uL (ref 0.0–0.2)
Basos: 1 %
EOS (ABSOLUTE): 0.2 10*3/uL (ref 0.0–0.4)
Eos: 3 %
Hematocrit: 43.6 % (ref 34.0–46.6)
Hemoglobin: 15.1 g/dL (ref 11.1–15.9)
Immature Grans (Abs): 0 10*3/uL (ref 0.0–0.1)
Immature Granulocytes: 0 %
Lymphocytes Absolute: 1.7 10*3/uL (ref 0.7–3.1)
Lymphs: 28 %
MCH: 32.3 pg (ref 26.6–33.0)
MCHC: 34.6 g/dL (ref 31.5–35.7)
MCV: 93 fL (ref 79–97)
Monocytes Absolute: 0.4 10*3/uL (ref 0.1–0.9)
Monocytes: 6 %
Neutrophils Absolute: 3.9 10*3/uL (ref 1.4–7.0)
Neutrophils: 62 %
Platelets: 262 10*3/uL (ref 150–450)
RBC: 4.68 x10E6/uL (ref 3.77–5.28)
RDW: 11.8 % (ref 11.7–15.4)
WBC: 6.2 10*3/uL (ref 3.4–10.8)

## 2022-06-03 LAB — RENAL FUNCTION PANEL
Albumin: 4.6 g/dL (ref 3.9–5.0)
BUN/Creatinine Ratio: 6 — ABNORMAL LOW (ref 9–23)
BUN: 5 mg/dL — ABNORMAL LOW (ref 6–20)
CO2: 22 mmol/L (ref 20–29)
Calcium: 10.1 mg/dL (ref 8.7–10.2)
Chloride: 101 mmol/L (ref 96–106)
Creatinine, Ser: 0.79 mg/dL (ref 0.57–1.00)
Glucose: 85 mg/dL (ref 70–99)
Phosphorus: 3 mg/dL (ref 3.0–4.3)
Potassium: 4 mmol/L (ref 3.5–5.2)
Sodium: 139 mmol/L (ref 134–144)
eGFR: 109 mL/min/{1.73_m2} (ref >59)

## 2022-06-03 LAB — TSH: TSH: 1.97 u[IU]/mL (ref 0.450–4.500)

## 2022-07-16 ENCOUNTER — Telehealth: Payer: Self-pay | Admitting: Family Medicine

## 2022-07-16 NOTE — Telephone Encounter (Signed)
Copied from CRM [id number]. Topic: Referral - Question >> Jul 16, 2022 11:06 AM Everette C wrote: Reason for CRM: Reason for CRM: Sophie with [facility] has called to share that their facility has been unable to contact the patient regarding her recent referral     Please contact the facility further if needed

## 2022-07-16 NOTE — Telephone Encounter (Signed)
Noted  KP 

## 2022-09-08 ENCOUNTER — Ambulatory Visit: Payer: BC Managed Care – PPO | Admitting: Family Medicine

## 2022-09-14 ENCOUNTER — Encounter: Payer: Self-pay | Admitting: Family Medicine

## 2022-09-14 ENCOUNTER — Ambulatory Visit (INDEPENDENT_AMBULATORY_CARE_PROVIDER_SITE_OTHER): Payer: BC Managed Care – PPO | Admitting: Family Medicine

## 2022-09-14 VITALS — BP 130/88 | HR 68 | Ht 69.0 in | Wt 153.0 lb

## 2022-09-14 DIAGNOSIS — Z8249 Family history of ischemic heart disease and other diseases of the circulatory system: Secondary | ICD-10-CM

## 2022-09-14 DIAGNOSIS — R5383 Other fatigue: Secondary | ICD-10-CM | POA: Diagnosis not present

## 2022-09-14 NOTE — Progress Notes (Signed)
Date:  09/14/2022   Name:  Brenda SUKHU   DOB:  [birth year]   MRN:  [id number]   Chief Complaint: consultation cardiology  Patient is a 21 year old female who presents for a cardiac evaluation exam. The patient reports the following problems:fatigue/ family history of genetic cardiomyopathy. Health maintenance has been reviewed up to date    Heart Problem Chronicity: surveillence. Progression since onset: pending eval. Pertinent negatives include no chest pain, congestion, coughing, diaphoresis, fatigue, joint swelling, myalgias or sore throat.    Lab Results  Component Value Date   NA 139 06/02/2022   K 4.0 06/02/2022   CO2 22 06/02/2022   GLUCOSE 85 06/02/2022   BUN 5 (L) 06/02/2022   CREATININE 0.79 06/02/2022   CALCIUM 10.1 06/02/2022   EGFR 109 06/02/2022   No results found for: "CHOL", "HDL", "LDLCALC", "LDLDIRECT", "TRIG", "CHOLHDL" Lab Results  Component Value Date   TSH 1.970 06/02/2022   No results found for: "HGBA1C" Lab Results  Component Value Date   WBC 6.2 06/02/2022   HGB 15.1 06/02/2022   HCT 43.6 06/02/2022   MCV 93 06/02/2022   PLT 262 06/02/2022   No results found for: "ALT", "AST", "GGT", "ALKPHOS", "BILITOT" No results found for: "25OHVITD2", "25OHVITD3", "VD25OH"   Review of Systems  Constitutional:  Negative for appetite change, diaphoresis, fatigue and unexpected weight change.  HENT:  Negative for congestion and sore throat.   Respiratory:  Negative for cough, chest tightness and shortness of breath.   Cardiovascular:  Negative for chest pain, palpitations and leg swelling.       No orthopnea/no pnd  Gastrointestinal:  Negative for blood in stool.  Musculoskeletal:  Positive for back pain. Negative for joint swelling and myalgias.    There are no problems to display for this patient.   No Known Allergies  No past surgical history on file.  Social History   Tobacco Use   Smoking status: Never   Smokeless tobacco: Never   Substance Use Topics   Alcohol use: Never   Drug use: Never     Medication list has been reviewed and updated.  No outpatient medications have been marked as taking for the 09/14/22 encounter (Office Visit) with Juline Patch, MD.       09/14/2022   10:18 AM 06/02/2022   10:35 AM 05/02/2021   10:51 AM 04/19/2020    8:58 AM  GAD 7 : Generalized Anxiety Score  Nervous, Anxious, on Edge 0 0 0 0  Control/stop worrying 0 0 0 0  Worry too much - different things 0 0 0 0  Trouble relaxing 0 0 0 0  Restless 0 0 0 0  Easily annoyed or irritable 0 0 0 0  Afraid - awful might happen 0 0 0 0  Total GAD 7 Score 0 0 0 0  Anxiety Difficulty Not difficult at all Not difficult at all         09/14/2022   10:18 AM 06/02/2022   10:35 AM 05/02/2021   10:51 AM  Depression screen PHQ 2/9  Decreased Interest 0 0 0  Down, Depressed, Hopeless 0 0 0  PHQ - 2 Score 0 0 0  Altered sleeping 0 0 0  Tired, decreased energy 0 0 0  Change in appetite 0 0 0  Feeling bad or failure about yourself  0 0 0  Trouble concentrating 0 0 0  Moving slowly or fidgety/restless 0 0 0  Suicidal thoughts 0 0  0  PHQ-9 Score 0 0 0  Difficult doing work/chores Not difficult at all Not difficult at all     BP Readings from Last 3 Encounters:  09/14/22 130/88  06/02/22 120/64  09/27/21 124/67    Physical Exam Vitals and nursing note reviewed. Exam conducted with a chaperone present.  Constitutional:      General: She is not in acute distress.    Appearance: She is not diaphoretic.  HENT:     Head: Normocephalic and atraumatic.     Right Ear: Tympanic membrane and external ear normal.     Left Ear: Tympanic membrane and external ear normal.  Eyes:     Conjunctiva/sclera: Conjunctivae normal.     Pupils: Pupils are equal, round, and reactive to light.  Neck:     Thyroid: No thyromegaly.     Vascular: No hepatojugular reflux or JVD.  Cardiovascular:     Rate and Rhythm: Normal rate and regular rhythm.      Pulses:          Carotid pulses are 2+ on the right side and 2+ on the left side.      Radial pulses are 2+ on the right side and 2+ on the left side.     Heart sounds: Normal heart sounds, S1 normal and S2 normal. No murmur heard.    No systolic murmur is present.     No diastolic murmur is present.     No friction rub. No gallop. No S3 or S4 sounds.  Pulmonary:     Effort: Pulmonary effort is normal.     Breath sounds: Normal breath sounds. No wheezing or rhonchi.  Abdominal:     Palpations: There is no hepatomegaly or splenomegaly.  Musculoskeletal:        General: Normal range of motion.     Cervical back: Normal range of motion and neck supple.     Right lower leg: No edema.     Left lower leg: No edema.  Lymphadenopathy:     Cervical: No cervical adenopathy.  Skin:    General: Skin is warm and dry.  Neurological:     Mental Status: She is alert.     Deep Tendon Reflexes: Reflexes are normal and symmetric.     Wt Readings from Last 3 Encounters:  09/14/22 153 lb (69.4 kg)  06/02/22 158 lb (71.7 kg)  09/27/21 130 lb (59 kg)    BP 130/88   Pulse 68   Ht '5\' 9"'  (1.753 m)   Wt 153 lb (69.4 kg)   LMP 09/04/2022 (Exact Date)   BMI 22.59 kg/m   Assessment and Plan:  1. Fatigue, unspecified type New onset.  But episodic and mild in nature recently was evaluated with lab work.  There is no DOE or PND or orthopnea nor palpitations/chest discomfort.  The concern is that the patient has a family history of a cardiomyopathy that is genetic in nature.  We will refer to cardiology in the patient's choice and family's choice is Dr Fletcher Anon who made no of this family circumstance.  Examination is normal size heart without murmur without gallop. - Ambulatory referral to Cardiology  2. Family history of first-degree relative with cardiomyopathy Recent evaluation of her sister is positive for this cardiomyopathy and because of the first-degree nature of the relative we will refer to  cardiology for evaluation and possible further testing. - Ambulatory referral to Cardiology    Otilio Miu, MD

## 2022-10-16 DIAGNOSIS — Z124 Encounter for screening for malignant neoplasm of cervix: Secondary | ICD-10-CM | POA: Diagnosis not present

## 2022-10-16 DIAGNOSIS — Z01419 Encounter for gynecological examination (general) (routine) without abnormal findings: Secondary | ICD-10-CM | POA: Diagnosis not present

## 2022-11-11 ENCOUNTER — Encounter: Payer: Self-pay | Admitting: Cardiovascular Disease

## 2022-11-11 ENCOUNTER — Ambulatory Visit: Payer: BC Managed Care – PPO | Attending: Cardiovascular Disease | Admitting: Cardiovascular Disease

## 2022-11-11 VITALS — BP 130/76 | HR 93 | Ht 69.0 in | Wt 159.2 lb

## 2022-11-11 DIAGNOSIS — R011 Cardiac murmur, unspecified: Secondary | ICD-10-CM

## 2022-11-11 DIAGNOSIS — I429 Cardiomyopathy, unspecified: Secondary | ICD-10-CM | POA: Diagnosis not present

## 2022-11-11 NOTE — Progress Notes (Signed)
Cardiology Office Note   Date:  11/11/2022   ID:  VAUDIE ENGEBRETSEN, DOB 09-12-2001, MRN [id number]  PCP:  Brenda Limerick, MD  Cardiologist:   Lorine Bears, MD   Chief Complaint  Patient presents with   Other    Fatigue/Fam hx cardiomyopathy. Meds reviewed verbally with pt.      History of Present Illness: Brenda Medina is a 20 y.o. female who was referred by Dr. Yetta Medina for evaluation of family history of familial cardiomyopathy.  Her father is one of my patients with prolonged history of nonischemic cardiomyopathy.  He had severe disease at some point and was evaluated for cardiac transplant but subsequently improved and has been stable.  He was seen by Dr. Jomarie Medina and was found to have a TMEM43 mutation. Her older brother also had cardiomyopathy and unfortunately died of heart failure complications after he received an LVAD.  Her twin sister was recently diagnosed with heart failure post pregnancy with an ejection fraction of 30%.  The patient never had any screening test done.  She reports mild fatigue but no chest pain or shortness of breath.  No other cardiac symptoms.    History reviewed. No pertinent past medical history.  History reviewed. No pertinent surgical history.   No current outpatient medications on file.   No current facility-administered medications for this visit.    Allergies:   Patient has no known allergies.    Social History:  The patient  reports that she has never smoked. She has never used smokeless tobacco. She reports that she does not drink alcohol and does not use drugs.   Family History:  The patient's family history includes Cancer in her mother; Heart disease in her father; Heart failure in her brother, father, and sister.    ROS:  Please see the history of present illness.   Otherwise, review of systems are positive for none.   All other systems are reviewed and negative.    PHYSICAL EXAM: VS:  BP 130/76 (BP Location: Right Arm, Patient  Position: Sitting, Cuff Size: Normal)   Pulse 93   Ht 5\' 9"  (1.753 m)   Wt 159 lb 4 oz (72.2 kg)   SpO2 98%   BMI 23.52 kg/m  , BMI Body mass index is 23.52 kg/m. GEN: Well nourished, well developed, in no acute distress  HEENT: normal  Neck: no JVD, carotid bruits, or masses Cardiac: RRR; no rubs, or gallops,no edema .  1 out of 6 systolic murmur in the aortic area Respiratory:  clear to auscultation bilaterally, normal work of breathing GI: soft, nontender, nondistended, + BS MS: no deformity or atrophy  Skin: warm and dry, no rash Neuro:  Strength and sensation are intact Psych: euthymic mood, full affect   EKG:  EKG is ordered today. The ekg ordered today demonstrates normal sinus rhythm with nonspecific T wave changes.   Recent Labs: 06/02/2022: BUN 5; Creatinine, Ser 0.79; Hemoglobin 15.1; Platelets 262; Potassium 4.0; Sodium 139; TSH 1.970    Lipid Panel No results found for: "CHOL", "TRIG", "HDL", "CHOLHDL", "VLDL", "LDLCALC", "LDLDIRECT"    Wt Readings from Last 3 Encounters:  11/11/22 159 lb 4 oz (72.2 kg)  09/14/22 153 lb (69.4 kg)  06/02/22 158 lb (71.7 kg)          11/11/2022    2:44 PM  PAD Screen  Previous PAD dx? No  Previous surgical procedure? No  Pain with walking? No  Feet/toe relief with dangling? No  Painful, non-healing ulcers? No  Extremities discolored? No      ASSESSMENT AND PLAN:  1.  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  Multiple first-degree relatives with cardiomyopathy and advanced heart failure.  I recommend evaluation with an echocardiogram.  In addition, I am referring her to Dr. Jomarie Medina to see if she carries the mutation.  2.  Cardiac murmur: Seems to be functional but we have to exclude bicuspid aortic valve.  This will be evaluated with upcoming echocardiogram.   Disposition:   FU with me in 6 months  Signed,  Lorine Bears, MD   11/11/2022 2:54 PM    [facility]

## 2022-11-11 NOTE — Patient Instructions (Signed)
Medication Instructions:  No changes *If you need a refill on your cardiac medications before your next appointment, please call your pharmacy*   Lab Work: None ordered If you have labs (blood work) drawn today and your tests are completely normal, you will receive your results only by: MyChart Message (if you have MyChart) OR A paper copy in the mail If you have any lab test that is abnormal or we need to change your treatment, we will call you to review the results.   Testing/Procedures: Your physician has requested that you have an echocardiogram. Echocardiography is a painless test that uses sound waves to create images of your heart. It provides your doctor with information about the size and shape of your heart and how well your heart's chambers and valves are working.   You may receive an ultrasound enhancing agent through an IV if needed to better visualize your heart during the echo. This procedure takes approximately one hour.  There are no restrictions for this procedure.  This will take place at [address] ([facility] [address], [place] [address]   Follow-Up: At [facility], you and your health needs are our priority.  As part of our continuing mission to provide you with exceptional heart care, we have created designated Provider Care Teams.  These Care Teams include your primary Cardiologist (physician) and Advanced Practice Providers (APPs -  Physician Assistants and Nurse Practitioners) who all work together to provide you with the care you need, when you need it.  We recommend signing up for the patient portal called "MyChart".  Sign up information is provided on this After Visit Summary.  MyChart is used to connect with patients for Virtual Visits (Telemedicine).  Patients are able to view lab/test results, encounter notes, upcoming appointments, etc.  Non-urgent messages can be sent to your provider as well.   To learn more about what you can do  with MyChart, go to [email or web address].    Your next appointment:   6 month(s)  The format for your next appointment:   In Person  Provider:   You may see Dr. Kirke Corin or one of the following Advanced Practice Providers on your designated Care Team:   Nicolasa Ducking, NP Eula Listen, PA-C Cadence Fransico Michael, PA-C Charlsie Quest, NP    Other Instructions A referral has been placed to our [facility] physician

## 2023-01-01 ENCOUNTER — Ambulatory Visit: Payer: BC Managed Care – PPO | Attending: Cardiovascular Disease

## 2023-01-01 DIAGNOSIS — I429 Cardiomyopathy, unspecified: Secondary | ICD-10-CM

## 2023-01-02 LAB — ECHOCARDIOGRAM COMPLETE
AR max vel: 2.33 cm2
AV Area VTI: 2.2 cm2
AV Area mean vel: 2.23 cm2
AV Mean grad: 3 mmHg
AV Peak grad: 5 mmHg
Ao pk vel: 1.12 m/s
Area-P 1/2: 3.21 cm2
Calc EF: 56.8 %
S' Lateral: 2.9 cm
Single Plane A2C EF: 57.9 %
Single Plane A4C EF: 55.2 %

## 2023-02-19 DIAGNOSIS — H53043 Amblyopia suspect, bilateral: Secondary | ICD-10-CM | POA: Diagnosis not present

## 2023-02-19 DIAGNOSIS — H5015 Alternating exotropia: Secondary | ICD-10-CM | POA: Diagnosis not present

## 2023-02-19 DIAGNOSIS — Z0100 Encounter for examination of eyes and vision without abnormal findings: Secondary | ICD-10-CM | POA: Diagnosis not present

## 2023-03-30 ENCOUNTER — Ambulatory Visit: Payer: BC Managed Care – PPO | Admitting: Genetic Counselor

## 2023-03-31 NOTE — Progress Notes (Signed)
Pre-test Genetic Consult notes   Referring Provider: Lorine Bears, MD  Referral Reason  Kota Zusman was referred for a genetic consult of familial cardiomyopathy.   Personal Medical Information Brenda Medina is the youngest daughter of Rawda Benally (DOB: 11/03/1968) who was diagnosed with CHF at 49. In addition, her oldest brother was also diagnosed with CHF at age 22 and died soon after.    Her fraternal twin sister was recently diagnosed with post-partum cardiomyopathy and had an ICD implanted soon after her delivery when she passed out while in a bath. She tells me that her symptoms of shortness of breath began at her 7 month of pregnancy and she then had chest pains after her delivery.   She is here today with her fiance, Elease Etienne. They will be married this September. Jonalyn is extremely concerned about her risk of having a cardiomyopathy after she gets pregnant and would like to undergo genetic testing for the familial variant that was detected in her father, namely TMEM43 c.424G>A, p.[id number] [phone]).  I informed her that this TMEM43 variant is not considered pathogenic because her paternal grandmother who is now age 21 also harbors this variant but does not have any cardiac issues. I explained to her that if the [facility] E142K variant was pathogenic, then her paternal grandmother would be having heart-related issues and symptoms. While some young family members may not have symptoms, with age the symptoms become evident. As she is in good cardiac health, it is highly unlikely that her father's condition is due to the [facility] E142K variant. She understands this.   Her fraternal twin sister also underwent genetic testing for the familial TMEM43 variant when she was found to have postpartum cardiomyopathy. She tested positive for this variant and the [facility] lab classified this variant as likely benign- indicating that it is not causal to her condition.   Also explained to her that postpartum  cardiomyopathy does not have a genetic etiology but is associated with risk factors including high blood pressure, diabetes, obesity etc. among others. Currently, there is no definitive studies indicating that this is an inherited condition.  Plans Explained to her that genetic testing for the familial TMEM43 variant is unwarranted as it is not causal to her father's condition. Informed her that it is important to undergo regular surveillance by cardiac screening of echocardiogram and EKG every 2-3 years and to seek medical care if she is having symptoms of chest discomfort, dizziness or blacking out.  She understands this.  Lattie Corns, Ph.D, [facility] Clinical Molecular Geneticist

## 2023-05-12 ENCOUNTER — Ambulatory Visit: Payer: BC Managed Care – PPO | Admitting: Cardiovascular Disease

## 2023-08-05 ENCOUNTER — Ambulatory Visit: Payer: BC Managed Care – PPO | Admitting: Cardiovascular Disease

## 2023-10-05 ENCOUNTER — Encounter: Payer: Self-pay | Admitting: Cardiovascular Disease

## 2023-10-05 ENCOUNTER — Ambulatory Visit: Payer: BC Managed Care – PPO | Attending: Cardiovascular Disease | Admitting: Cardiovascular Disease

## 2023-10-05 VITALS — BP 120/60 | HR 83 | Ht 70.0 in | Wt 128.2 lb

## 2023-10-05 DIAGNOSIS — R011 Cardiac murmur, unspecified: Secondary | ICD-10-CM

## 2023-10-05 DIAGNOSIS — Z8249 Family history of ischemic heart disease and other diseases of the circulatory system: Secondary | ICD-10-CM | POA: Diagnosis not present

## 2023-10-05 NOTE — Progress Notes (Addendum)
Cardiology Office Note   Date:  10/05/2023   ID:  LEONNA Medina, DOB 01-08-2001, MRN [id number]  PCP:  [name], MD  Cardiologist:   Lorine Bears, MD   Chief Complaint  Patient presents with   Follow-up    6 month follow up visit. Meds reviewed.       History of Present Illness: Brenda Medina is a 22 y.o. female who is here today for a follow-up visit regarding family history of cardiomyopathy.    Her father is one of my patients with prolonged history of nonischemic cardiomyopathy.  He had severe disease at some point and was evaluated for cardiac transplant but subsequently improved and has been stable.  He was seen by Dr. Jomarie Longs and was found to have a TMEM43 mutation. Her older brother also had cardiomyopathy and unfortunately died of heart failure complications after he received an LVAD.  Her twin sister was recently diagnosed with heart failure post pregnancy with an ejection fraction of 30%.  She had an echocardiogram done in January of this year which showed normal LV systolic function with no significant valvular abnormalities.  He has been doing well with no chest pain, shortness of breath or palpitations.  History reviewed. No pertinent past medical history.  History reviewed. No pertinent surgical history.   No current outpatient medications on file.   No current facility-administered medications for this visit.    Allergies:   Patient has no known allergies.    Social History:  The patient  reports that she has never smoked. She has never used smokeless tobacco. She reports that she does not drink alcohol and does not use drugs.   Family History:  The patient's family history includes Cancer in her mother; Heart disease in her father; Heart failure in her brother, father, and sister.    ROS:  Please see the history of present illness.   Otherwise, review of systems are positive for none.   All other systems are reviewed and negative.    PHYSICAL  EXAM: VS:  BP 120/60 (BP Location: Left Arm, Patient Position: Sitting, Cuff Size: Normal)   Pulse 83   Ht 5\' 10"  (1.778 m)   Wt 128 lb 3.2 oz (58.2 kg)   SpO2 100%   BMI 18.39 kg/m  , BMI Body mass index is 18.39 kg/m. GEN: Well nourished, well developed, in no acute distress  HEENT: normal  Neck: no JVD, carotid bruits, or masses Cardiac: RRR; no rubs, or gallops,no edema .  1 out of 6 systolic murmur in the aortic area Respiratory:  clear to auscultation bilaterally, normal work of breathing GI: soft, nontender, nondistended, + BS MS: no deformity or atrophy  Skin: warm and dry, no rash Neuro:  Strength and sensation are intact Psych: euthymic mood, full affect   EKG:  EKG is ordered today. The ekg ordered today demonstrates normal sinus rhythm with nonspecific T wave changes.   Recent Labs: No results found for requested labs within last 365 days.    Lipid Panel No results found for: "CHOL", "TRIG", "HDL", "CHOLHDL", "VLDL", "LDLCALC", "LDLDIRECT"    Wt Readings from Last 3 Encounters:  10/05/23 128 lb 3.2 oz (58.2 kg)  11/11/22 159 lb 4 oz (72.2 kg)  09/14/22 153 lb (69.4 kg)          11/11/2022    2:44 PM  PAD Screen  Previous PAD dx? No  Previous surgical procedure? No  Pain with walking? No  Feet/toe relief with dangling? No  Painful, non-healing ulcers? No  Extremities discolored? No      ASSESSMENT AND PLAN:  1.  Screening for familial cardiomyopathy: Her EKG remains normal.  Echocardiogram in January of this year was completely normal.   I will discuss with Dr. Jomarie Longs whether she should be tested forTMEM43 mutation.  Does a negative result eliminate the need for surveillance?  2.  Cardiac murmur: Seems to be functional .  I reviewed her echocardiogram.  There was borderline prolapse of the mitral valve leaflets but only trivial regurgitation.  Also the the aortic valve cusps were not well-visualized to be certain that the valve was  tricuspid. Will continue to monitor clinically for now.   Addendum: I received the following response from Dr. Jomarie Longs regarding genetic testing: the TMEM43 variant is currently one of unknown clinical significance and has not been classified as a definitive mutation. Some labs have already begun calling this variant as likely benign!   We are hoping to be able to build a disease segregation profile in her family to see if this variant segregates with disease. If she is not showing features of heart failure, then testing her will only add to the confusion of being "negative".   I would NOT recommend that she gets tested for the TMEM43 variant- instead preferable that she gets routine screening for cardiomyopathy.    Disposition:   FU with me in 12 months  Signed,  Lorine Bears, MD  10/05/2023 2:46 PM    [facility]

## 2023-10-05 NOTE — Patient Instructions (Signed)
Medication Instructions:  No changes *If you need a refill on your cardiac medications before your next appointment, please call your pharmacy*   Lab Work: None ordered If you have labs (blood work) drawn today and your tests are completely normal, you will receive your results only by: MyChart Message (if you have MyChart) OR A paper copy in the mail If you have any lab test that is abnormal or we need to change your treatment, we will call you to review the results.   Testing/Procedures: None ordered   Follow-Up: At [facility], you and your health needs are our priority.  As part of our continuing mission to provide you with exceptional heart care, we have created designated Provider Care Teams.  These Care Teams include your primary Cardiologist (physician) and Advanced Practice Providers (APPs -  Physician Assistants and Nurse Practitioners) who all work together to provide you with the care you need, when you need it.  We recommend signing up for the patient portal called "MyChart".  Sign up information is provided on this After Visit Summary.  MyChart is used to connect with patients for Virtual Visits (Telemedicine).  Patients are able to view lab/test results, encounter notes, upcoming appointments, etc.  Non-urgent messages can be sent to your provider as well.   To learn more about what you can do with MyChart, go to [email or web address].    Your next appointment:   12 month(s)  Provider:   You may see Muhammad Arida, MD or one of the following Advanced Practice Providers on your designated Care Team:   Christopher Berge, NP Ryan Dunn, PA-C Cadence Furth, PA-C Sheri Hammock, NP    

## 2023-10-19 ENCOUNTER — Encounter: Payer: Self-pay | Admitting: Cardiovascular Disease

## 2023-10-27 DIAGNOSIS — N912 Amenorrhea, unspecified: Secondary | ICD-10-CM | POA: Diagnosis not present

## 2023-11-24 DIAGNOSIS — Z3401 Encounter for supervision of normal first pregnancy, first trimester: Secondary | ICD-10-CM | POA: Diagnosis not present

## 2023-11-24 DIAGNOSIS — Z113 Encounter for screening for infections with a predominantly sexual mode of transmission: Secondary | ICD-10-CM | POA: Diagnosis not present

## 2023-11-24 LAB — OB RESULTS CONSOLE VARICELLA ZOSTER ANTIBODY, IGG: Varicella: IMMUNE

## 2023-11-24 LAB — OB RESULTS CONSOLE RUBELLA ANTIBODY, IGM: Rubella: IMMUNE

## 2023-11-24 LAB — OB RESULTS CONSOLE HEPATITIS B SURFACE ANTIGEN: Hepatitis B Surface Ag: NEGATIVE

## 2023-12-22 NOTE — L&D Delivery Note (Signed)
 Delivery Note At 7:04 AM a viable female was delivered via Vaginal, Spontaneous (Presentation: Right Occiput Anterior).  APGAR: 8, 9; weight pending.   Placenta status: Spontaneous, Intact.  Cord: 3 vessels with the following complications: None.  Cord pH: n/a  Anesthesia: Epidural Episiotomy: None Lacerations: None Suture Repair: n/a Est. Blood Loss (mL): 150  Mom to postpartum.  Baby to Couplet care / Skin to Skin.  Called to see patient.  Mom pushed to deliver a viable female infant.  The head followed by shoulders, which delivered without difficulty, and the rest of the body.  No nuchal cord noted.  Baby to mom's chest.  Cord clamped and cut after > 1 min delay.  Cord blood obtained.  Placenta delivered spontaneously, intact, with a 3-vessel cord.  No vaginal, cervical, or perineal lacerations. All counts correct.  Hemostasis obtained with IV pitocin and fundal massage. EBL 150 mL.     Garnette Mace, MD 06/20/2024, 7:23 AM

## 2023-12-27 ENCOUNTER — Telehealth: Payer: Self-pay | Admitting: Cardiovascular Disease

## 2023-12-27 DIAGNOSIS — I429 Cardiomyopathy, unspecified: Secondary | ICD-10-CM

## 2023-12-27 NOTE — Telephone Encounter (Signed)
 Patient called in requesting an echo and an appointment with Dr. Kirke Corin d/t pregnancy. Advised I would send a message regarding the echo, and the appointment with Dr. Kirke Corin would be made thereafter. She agreed to that plan and looks forward to hearing back.

## 2023-12-27 NOTE — Telephone Encounter (Signed)
 Okay, lets obtain an echocardiogram.  Diagnosis is familial cardiomyopathy.

## 2023-12-27 NOTE — Telephone Encounter (Signed)
 I spoke with the patient. She mentioned that her OB advised her to contact her cardiologist for an ECHO due to her pregnancy and a family history of cardiomyopathy  Will forward to MD for recommendations

## 2023-12-27 NOTE — Telephone Encounter (Signed)
 Pt made aware ECHO order placed and message sent to scheduling department.

## 2024-01-07 ENCOUNTER — Other Ambulatory Visit: Payer: Self-pay | Admitting: Obstetrics and Gynecology

## 2024-01-07 DIAGNOSIS — Z363 Encounter for antenatal screening for malformations: Secondary | ICD-10-CM

## 2024-01-07 DIAGNOSIS — Z8249 Family history of ischemic heart disease and other diseases of the circulatory system: Secondary | ICD-10-CM

## 2024-01-10 DIAGNOSIS — Z1379 Encounter for other screening for genetic and chromosomal anomalies: Secondary | ICD-10-CM | POA: Diagnosis not present

## 2024-01-10 DIAGNOSIS — O0992 Supervision of high risk pregnancy, unspecified, second trimester: Secondary | ICD-10-CM | POA: Diagnosis not present

## 2024-01-12 ENCOUNTER — Ambulatory Visit: Payer: BC Managed Care – PPO

## 2024-01-24 ENCOUNTER — Ambulatory Visit: Payer: BC Managed Care – PPO | Attending: Cardiovascular Disease

## 2024-01-24 DIAGNOSIS — I429 Cardiomyopathy, unspecified: Secondary | ICD-10-CM | POA: Diagnosis not present

## 2024-01-24 LAB — ECHOCARDIOGRAM COMPLETE
AR max vel: 2.53 cm2
AV Area VTI: 2.46 cm2
AV Area mean vel: 2.53 cm2
AV Mean grad: 4 mm[Hg]
AV Peak grad: 7.2 mm[Hg]
Ao pk vel: 1.34 m/s
Area-P 1/2: 4.89 cm2
Calc EF: 50.6 %
S' Lateral: 3.2 cm
Single Plane A2C EF: 51.1 %
Single Plane A4C EF: 51.6 %

## 2024-01-27 DIAGNOSIS — Z3402 Encounter for supervision of normal first pregnancy, second trimester: Secondary | ICD-10-CM | POA: Diagnosis not present

## 2024-01-31 ENCOUNTER — Ambulatory Visit: Payer: BC Managed Care – PPO | Admitting: Cardiology

## 2024-02-09 ENCOUNTER — Ambulatory Visit: Payer: BC Managed Care – PPO

## 2024-02-09 ENCOUNTER — Other Ambulatory Visit: Payer: BC Managed Care – PPO

## 2024-02-10 LAB — OB RESULTS CONSOLE RUBELLA ANTIBODY, IGM: Rubella: IMMUNE

## 2024-02-17 ENCOUNTER — Encounter: Payer: Self-pay | Admitting: *Deleted

## 2024-02-17 DIAGNOSIS — Z8249 Family history of ischemic heart disease and other diseases of the circulatory system: Secondary | ICD-10-CM | POA: Insufficient documentation

## 2024-02-22 ENCOUNTER — Ambulatory Visit (HOSPITAL_BASED_OUTPATIENT_CLINIC_OR_DEPARTMENT_OTHER): Payer: BC Managed Care – PPO | Admitting: Maternal & Fetal Medicine

## 2024-02-22 ENCOUNTER — Ambulatory Visit: Payer: BC Managed Care – PPO | Attending: Obstetrics and Gynecology

## 2024-02-22 ENCOUNTER — Other Ambulatory Visit: Payer: Self-pay

## 2024-02-22 ENCOUNTER — Ambulatory Visit: Payer: BC Managed Care – PPO | Admitting: *Deleted

## 2024-02-22 DIAGNOSIS — Z8249 Family history of ischemic heart disease and other diseases of the circulatory system: Secondary | ICD-10-CM

## 2024-02-22 DIAGNOSIS — Z363 Encounter for antenatal screening for malformations: Secondary | ICD-10-CM | POA: Insufficient documentation

## 2024-02-22 NOTE — Addendum Note (Signed)
 Addended by: Novella Olive on: 02/22/2024 01:50 PM   Modules accepted: Orders

## 2024-02-22 NOTE — Progress Notes (Signed)
 MFM Consultation  Brenda Medina is a 23 yo G1P0 at 20 w 0d with an EDD of 06/20/24.  She is seen today at the request of Dr. Feliberto Gottron due to family history of cardiomyopathy.  Brenda Medina is doing well without complaints. She denies s/sx of preeclampsia or preterm labor. She denies SOB or chest pain.   She has a low risk NIPT, AFP and carrier screening.  [name] doesn't have personal history of cardiomyopathy. She has had a normal echocardiogram in February 2025. Brenda Medina shared that her father was diagnosed with cardiomyopathy in his 78-30's. Her brother died of cardiomyopathy in his early 6's. She reported that her twin sister was diagnosed with postpartum cardiomyopathy after birth but resolved and had a ICD placed.   Brenda Medina saw a genetic counselor Sidney Ace, [place] PhD. (Please see Epic for details). 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 The conclusion of that visit was to defer any additional testing ane perform serial heart screening via EKG and echocardiogram.      10/05/2023    2:37 PM 11/11/2022    2:46 PM 11/11/2022    2:44 PM  Vitals with BMI  Height 5\' 10"   5\' 9"   Weight 128 lbs 3 oz  159 lbs 4 oz  BMI 18.39  23.51  Systolic 120 130 578  Diastolic 60 76 74  Pulse 83  93   OB History  Gravida Para Term Preterm AB Living  1 0 0 0 0 0  SAB IAB Ectopic Multiple Live Births  0 0 0 0 0    # Outcome Date GA Lbr Len/2nd Weight Sex Type Anes PTL Lv  1 Current            No past medical history on file. Past Surgical History:  Procedure Laterality Date   NO PAST SURGERIES     Social History   Socioeconomic History   Marital status: Married    Spouse name: Not on file   Number of children: Not on file   Years of education: Not on file   Highest education level: Not on file  Occupational History   Not on file  Tobacco Use   Smoking status: Never   Smokeless tobacco: Never  Vaping Use   Vaping  status: Never Used  Substance and Sexual Activity   Alcohol use: Never   Drug use: Never   Sexual activity: Yes  Other Topics Concern   Not on file  Social History Narrative   Not on file   Social Drivers of Health   Financial Resource Strain: Not on file  Food Insecurity: Not on file  Transportation Needs: Not on file  Physical Activity: Not on file  Stress: Not on file  Social Connections: Not on file  Intimate Partner Violence: Not on file   Family History  Problem Relation Age of Onset   Cancer Mother    Heart failure Father    Heart disease Father    Heart failure Sister    Heart failure Brother    Asthma Neg Hx    Hypertension Neg Hx          Current Outpatient Medications (Other):    prenatal vitamin w/FE, FA (PRENATAL 1 + 1) 27-1 MG TABS tablet, Take 1 tablet by mouth daily at 12 noon.  No Known Allergies  Imaging: Single intrauterine pregnancy with measurements consistent with dates. Normal anatomy without additional  markers of aneuploidy.  Good fetal movement and amniotic fluid volume. Suboptimal views of the fetal anatomy was obtained secondary to fetal position.   Impression/Counseling:  I discussed with Brenda Medina the normal nature of today's examination.   We discussed her recent visits with the cardiologist and genetic counselor.   I conveyed that she may have an increased risk for cardiomyopathy above the general population given her family history.   At this time she should be monitored for persistent chest pain and shortness of breath.   We recommend a fetal echocardiogram in the third trimester and in the postpartum period.   I recommend that she deliver where she can be supported if she develops post partum cardiomyopathy. It may even be reasonable to consider a diuretic in the postpartum period with Cardiology consultation.  Lastly, I recommend referral to Dr. Thomasene Ripple who specializes in cardiac disease in pregnancy and to possibly  lend her perspective to a postpartum preventative strategy.   Lastly, I recommend repeat growth in 4 weeks to complete the fetal anatomy.  I spent 60 minutes with > 50% in face to face consultation and review of medical records.  Novella Olive, MD

## 2024-03-03 ENCOUNTER — Telehealth: Payer: Self-pay

## 2024-03-03 NOTE — Telephone Encounter (Signed)
 Called pt to set up appt with Dr. Servando Salina. No answer, left message for her to return the call.

## 2024-03-28 DIAGNOSIS — O0992 Supervision of high risk pregnancy, unspecified, second trimester: Secondary | ICD-10-CM | POA: Diagnosis not present

## 2024-03-28 LAB — OB RESULTS CONSOLE HIV ANTIBODY (ROUTINE TESTING): HIV: NONREACTIVE

## 2024-03-31 ENCOUNTER — Ambulatory Visit (INDEPENDENT_AMBULATORY_CARE_PROVIDER_SITE_OTHER): Admitting: Cardiology

## 2024-03-31 ENCOUNTER — Ambulatory Visit: Admitting: Cardiology

## 2024-03-31 ENCOUNTER — Encounter: Payer: Self-pay | Admitting: Cardiology

## 2024-03-31 VITALS — BP 113/70 | HR 87 | Ht 70.0 in | Wt 160.4 lb

## 2024-03-31 DIAGNOSIS — Z3A28 28 weeks gestation of pregnancy: Secondary | ICD-10-CM | POA: Diagnosis not present

## 2024-03-31 DIAGNOSIS — I429 Cardiomyopathy, unspecified: Secondary | ICD-10-CM | POA: Diagnosis not present

## 2024-03-31 NOTE — Progress Notes (Signed)
 Cardio-Obstetrics Clinic  New Evaluation  Date:  04/01/2024   ID:  Brenda Medina, DOB 2001/01/09, MRN [id number]  PCP:  Clarise Crooks, MD   [facility] Providers Cardiologist:  Antionette Kirks, MD  Electrophysiologist:  None       Referring MD: Layla Prier*   Chief Complaint: " I am ok"  History of Present Illness:    Brenda Medina is a 23 y.o. female [G1P0] who is being seen today for the evaluation of family hx of cardiomyopathy at the request of Layla Prier*.   She does not have personal history of cardiomyopathy. Her brother had cardiomyopathy and unfortunately died of heart failure complications after he received an LVAD. Her twin sister was recently diagnosed with heart failure post pregnancy with an ejection fraction of 30%.   She is currently 28 weeks and 3 days .  The patient has had two normal echocardiograms and is currently asymptomatic. She has no personal history of cardiac disease. The patient's obstetrician has expressed concern about potential cardiac complications during delivery due to the family history.   Prior CV Studies Reviewed: The following studies were reviewed today: Echo normal  No past medical history on file.  Past Surgical History:  Procedure Laterality Date   NO PAST SURGERIES        OB History     Gravida  1   Para      Term      Preterm      AB      Living         SAB      IAB      Ectopic      Multiple      Live Births                  Current Medications: Current Meds  Medication Sig   prenatal vitamin w/FE, FA (PRENATAL 1 + 1) 27-1 MG TABS tablet Take 1 tablet by mouth daily at 12 noon.     Allergies:   Patient has no known allergies.   Social History   Socioeconomic History   Marital status: Married    Spouse name: Not on file   Number of children: Not on file   Years of education: Not on file   Highest education level: Not on file  Occupational History   Not  on file  Tobacco Use   Smoking status: Never   Smokeless tobacco: Never  Vaping Use   Vaping status: Never Used  Substance and Sexual Activity   Alcohol use: Never   Drug use: Never   Sexual activity: Yes  Other Topics Concern   Not on file  Social History Narrative   Not on file   Social Drivers of Health   Financial Resource Strain: Not on file  Food Insecurity: Not on file  Transportation Needs: Not on file  Physical Activity: Not on file  Stress: Not on file  Social Connections: Not on file      Family History  Problem Relation Age of Onset   Cancer Mother    Heart failure Father    Heart disease Father    Heart failure Sister    Heart failure Brother    Asthma Neg Hx    Hypertension Neg Hx       ROS:   Please see the history of present illness.     All other systems reviewed and are negative.   Labs/EKG Reviewed:  EKG:  none today   Recent Labs: No results found for requested labs within last 365 days.   Recent Lipid Panel No results found for: "CHOL", "TRIG", "HDL", "CHOLHDL", "LDLCALC", "LDLDIRECT"  Physical Exam:    VS:  BP 113/70 (BP Location: Left Arm, Patient Position: Sitting, Cuff Size: Normal)   Pulse 87   Ht 5\' 10"  (1.778 m)   Wt 160 lb 6.4 oz (72.8 kg)   LMP 09/14/2023   SpO2 100%   BMI 23.02 kg/m     Wt Readings from Last 3 Encounters:  03/31/24 160 lb 6.4 oz (72.8 kg)  10/05/23 128 lb 3.2 oz (58.2 kg)  11/11/22 159 lb 4 oz (72.2 kg)     GEN:  Well nourished, well developed in no acute distress HEENT: Normal NECK: No JVD; No carotid bruits LYMPHATICS: No lymphadenopathy CARDIAC: RRR, no murmurs, rubs, gallops RESPIRATORY:  Clear to auscultation without rales, wheezing or rhonchi  ABDOMEN: Soft, non-tender, non-distended MUSCULOSKELETAL:  No edema; No deformity  SKIN: Warm and dry NEUROLOGIC:  Alert and oriented x 3 PSYCHIATRIC:  Normal affect    Risk Assessment/Risk Calculators:     CARPREG II Risk Prediction  Index Score:  1.  The patient's risk for a primary cardiac event is 5%.   Modified World Health Organization [facility]) Classification of Maternal CV Risk   Class I         ASSESSMENT & PLAN:    Postpartum Cardiomyopathy Risk Risk due to family history, specifically her twin sister's postpartum cardiomyopathy. Asymptomatic with normal echocardiograms. Close monitoring warranted. - Schedule cardio OB echocardiogram at [redacted] weeks gestation. - Monitor for cardiomyopathy signs: swelling, dyspnea, syncope. - Coordinate care with MFM team and cardiologist. - Postpartum follow-up for 12 weeks if asymptomatic, extend to 6 months if symptoms develop.  Pregnancy Monitoring [redacted] weeks pregnant, monitored for cardiac issues due to family history. Current echocardiogram normal, cleared for vaginal delivery. - Coordinate with obstetrician for safe delivery. - Schedule follow-up appointments for cardiac monitoring. - Perform cardio OB echocardiogram at [redacted] weeks gestation.  Patient Instructions  Medication Instructions:  Your physician recommends that you continue on your current medications as directed. Please refer to the Current Medication list given to you today.  *If you need a refill on your cardiac medications before your next appointment, please call your pharmacy*   Testing/Procedures: Your physician has requested that you have an echocardiogram - OB (5-6 weeks from now). Echocardiography is a painless test that uses sound waves to create images of your heart. It provides your doctor with information about the size and shape of your heart and how well your heart's chambers and valves are working. This procedure takes approximately one hour. There are no restrictions for this procedure. Please do NOT wear cologne, perfume, aftershave, or lotions (deodorant is allowed). Please arrive 15 minutes prior to your appointment time.  Please note: We ask at that you not bring children with you during  ultrasound (echo/ vascular) testing. Due to room size and safety concerns, children are not allowed in the ultrasound rooms during exams. Our front office staff cannot provide observation of children in our lobby area while testing is being conducted. An adult accompanying a patient to their appointment will only be allowed in the ultrasound room at the discretion of the ultrasound technician under special circumstances. We apologize for any inconvenience.   Follow-Up: At [facility], you and your health needs are our priority.  As part of our continuing mission to  provide you with exceptional heart care, our providers are all part of one team.  This team includes your primary Cardiologist (physician) and Advanced Practice Providers or APPs (Physician Assistants and Nurse Practitioners) who all work together to provide you with the care you need, when you need it.  Your next appointment:   8 week(s)  Provider:   Tremain Rucinski, DO     Other Instructions   1st Floor: - Lobby - Registration  - Pharmacy  - Lab - Cafe  2nd Floor: - PV Lab - Diagnostic Testing (echo, CT, nuclear med)  3rd Floor: - Vacant  4th Floor: - TCTS (cardiothoracic surgery) - AFib Clinic - Structural Heart Clinic - Vascular Surgery  - Vascular Ultrasound  5th Floor: - HeartCare Cardiology (general and EP) - Clinical Pharmacy for coumadin, hypertension, lipid, weight-loss medications, and med management appointments    Valet parking services will be available as well.           Dispo:  No follow-ups on file.   Medication Adjustments/Labs and Tests Ordered: Current medicines are reviewed at length with the patient today.  Concerns regarding medicines are outlined above.  Tests Ordered: Orders Placed This Encounter  Procedures   ECHOCARDIOGRAM COMPLETE   Medication Changes: No orders of the defined types were placed in this encounter.

## 2024-03-31 NOTE — Patient Instructions (Addendum)
 Medication Instructions:  Your physician recommends that you continue on your current medications as directed. Please refer to the Current Medication list given to you today.  *If you need a refill on your cardiac medications before your next appointment, please call your pharmacy*   Testing/Procedures: Your physician has requested that you have an echocardiogram - OB (5-6 weeks from now). Echocardiography is a painless test that uses sound waves to create images of your heart. It provides your doctor with information about the size and shape of your heart and how well your heart's chambers and valves are working. This procedure takes approximately one hour. There are no restrictions for this procedure. Please do NOT wear cologne, perfume, aftershave, or lotions (deodorant is allowed). Please arrive 15 minutes prior to your appointment time.  Please note: We ask at that you not bring children with you during ultrasound (echo/ vascular) testing. Due to room size and safety concerns, children are not allowed in the ultrasound rooms during exams. Our front office staff cannot provide observation of children in our lobby area while testing is being conducted. An adult accompanying a patient to their appointment will only be allowed in the ultrasound room at the discretion of the ultrasound technician under special circumstances. We apologize for any inconvenience.   Follow-Up: At [facility], you and your health needs are our priority.  As part of our continuing mission to provide you with exceptional heart care, our providers are all part of one team.  This team includes your primary Cardiologist (physician) and Advanced Practice Providers or APPs (Physician Assistants and Nurse Practitioners) who all work together to provide you with the care you need, when you need it.  Your next appointment:   8 week(s)  Provider:   Thomasene Ripple, DO     Other Instructions   1st Floor: - Lobby -  Registration  - Pharmacy  - Lab - Cafe  2nd Floor: - PV Lab - Diagnostic Testing (echo, CT, nuclear med)  3rd Floor: - Vacant  4th Floor: - TCTS (cardiothoracic surgery) - AFib Clinic - Structural Heart Clinic - Vascular Surgery  - Vascular Ultrasound  5th Floor: - HeartCare Cardiology (general and EP) - Clinical Pharmacy for coumadin, hypertension, lipid, weight-loss medications, and med management appointments    Valet parking services will be available as well.

## 2024-05-09 ENCOUNTER — Ambulatory Visit (HOSPITAL_COMMUNITY)
Admission: RE | Admit: 2024-05-09 | Discharge: 2024-05-09 | Disposition: A | Discharge status: Home / Self Care | Source: Ambulatory Visit | Attending: Cardiology | Admitting: Cardiology

## 2024-05-09 DIAGNOSIS — I429 Cardiomyopathy, unspecified: Secondary | ICD-10-CM | POA: Insufficient documentation

## 2024-05-09 LAB — ECHOCARDIOGRAM COMPLETE
Area-P 1/2: 3.65 cm2
Calc EF: 58.1 %
S' Lateral: 3.3 cm
Single Plane A2C EF: 58.7 %
Single Plane A4C EF: 60.4 %

## 2024-05-13 ENCOUNTER — Ambulatory Visit: Payer: Self-pay | Admitting: Cardiology

## 2024-05-24 DIAGNOSIS — O0993 Supervision of high risk pregnancy, unspecified, third trimester: Secondary | ICD-10-CM | POA: Diagnosis not present

## 2024-05-24 DIAGNOSIS — Z113 Encounter for screening for infections with a predominantly sexual mode of transmission: Secondary | ICD-10-CM | POA: Diagnosis not present

## 2024-05-24 LAB — OB RESULTS CONSOLE GC/CHLAMYDIA
Chlamydia: NEGATIVE
Neisseria Gonorrhea: NEGATIVE

## 2024-05-24 LAB — OB RESULTS CONSOLE GBS: GBS: POSITIVE

## 2024-05-30 ENCOUNTER — Ambulatory Visit: Attending: Cardiology | Admitting: Cardiology

## 2024-05-30 ENCOUNTER — Encounter: Payer: Self-pay | Admitting: Cardiology

## 2024-05-30 VITALS — BP 109/68 | HR 75 | Ht 70.0 in | Wt 180.1 lb

## 2024-05-30 DIAGNOSIS — Z8249 Family history of ischemic heart disease and other diseases of the circulatory system: Secondary | ICD-10-CM | POA: Diagnosis not present

## 2024-05-30 DIAGNOSIS — R011 Cardiac murmur, unspecified: Secondary | ICD-10-CM

## 2024-05-30 DIAGNOSIS — Z3A37 37 weeks gestation of pregnancy: Secondary | ICD-10-CM

## 2024-05-30 NOTE — Progress Notes (Signed)
 Cardio-Obstetrics Clinic  Follow Up Note   Date:  05/30/2024   ID:  Brenda Medina, DOB 23-Jan-2001, MRN [id number]  PCP:  Clarise Crooks, MD (Inactive)   [facility] Providers Cardiologist:  Antionette Kirks, MD  Electrophysiologist:  None        Referring MD: Clarise Crooks, MD   Chief Complaint: " I am doing well"  History of Present Illness:    Brenda Medina is a 23 y.o. female [G1P0] who returns for follow up for cardiovascular care in pregnancy.  She has a family history of cardiomyopathy.  She does not have personal history of cardiomyopathy. Her brother had cardiomyopathy and unfortunately died of heart failure complications after he received an LVAD. Her twin sister was recently diagnosed with heart failure post pregnancy with an ejection fraction of 30%.   At her last visit given her family history of cardiomyopathy and with some family members having this during pregnancy an echocardiogram was repeated.  Thankfully her echo was normal.  She is here today for follow-up visit.  Prior CV Studies Reviewed: The following studies were reviewed today: Echo reviewed  No past medical history on file.  Past Surgical History:  Procedure Laterality Date   NO PAST SURGERIES        OB History     Gravida  1   Para      Term      Preterm      AB      Living         SAB      IAB      Ectopic      Multiple      Live Births                  Current Medications: Current Meds  Medication Sig   prenatal vitamin w/FE, FA (PRENATAL 1 + 1) 27-1 MG TABS tablet Take 1 tablet by mouth daily at 12 noon.     Allergies:   Patient has no known allergies.   Social History   Socioeconomic History   Marital status: Married    Spouse name: Not on file   Number of children: Not on file   Years of education: Not on file   Highest education level: Not on file  Occupational History   Not on file  Tobacco Use   Smoking status: Never   Smokeless  tobacco: Never  Vaping Use   Vaping status: Never Used  Substance and Sexual Activity   Alcohol use: Never   Drug use: Never   Sexual activity: Yes  Other Topics Concern   Not on file  Social History Narrative   Not on file   Social Drivers of Health   Financial Resource Strain: Not on file  Food Insecurity: Not on file  Transportation Needs: Not on file  Physical Activity: Not on file  Stress: Not on file  Social Connections: Not on file      Family History  Problem Relation Age of Onset   Cancer Mother    Heart failure Father    Heart disease Father    Heart failure Sister    Heart failure Brother    Asthma Neg Hx    Hypertension Neg Hx       ROS:   Please see the history of present illness.     All other systems reviewed and are negative.   Labs/EKG Reviewed:    EKG:   EKG  is  ordered today.  The ekg ordered today demonstrates sinus rhythm, heart rate 75 bpm  Recent Labs: No results found for requested labs within last 365 days.   Recent Lipid Panel No results found for: "CHOL", "TRIG", "HDL", "CHOLHDL", "LDLCALC", "LDLDIRECT"  Physical Exam:    VS:  BP 109/68 (BP Location: Left Arm, Patient Position: Sitting, Cuff Size: Normal)   Pulse 75   Ht 5\' 10"  (1.778 m)   Wt 180 lb 1.6 oz (81.7 kg)   LMP 09/14/2023   SpO2 99%   BMI 25.84 kg/m     Wt Readings from Last 3 Encounters:  05/30/24 180 lb 1.6 oz (81.7 kg)  03/31/24 160 lb 6.4 oz (72.8 kg)  10/05/23 128 lb 3.2 oz (58.2 kg)     GEN:  Well nourished, well developed in no acute distress HEENT: Normal NECK: No JVD; No carotid bruits LYMPHATICS: No lymphadenopathy CARDIAC: RRR, no murmurs, rubs, gallops RESPIRATORY:  Clear to auscultation without rales, wheezing or rhonchi  ABDOMEN: Soft, non-tender, non-distended MUSCULOSKELETAL:  No edema; No deformity  SKIN: Warm and dry NEUROLOGIC:  Alert and oriented x 3 PSYCHIATRIC:  Normal affect    Risk Assessment/Risk Calculators:     CARPREG  II Risk Prediction Index Score:  1.  The patient's risk for a primary cardiac event is 5%.   Modified World Health Organization [facility]) Classification of Maternal CV Risk   Class I         ASSESSMENT & PLAN:    Postpartum cardiomyopathy risk [redacted] weeks gestation  She does have significant family history of postpartum cardiomyopathy with her twin sister developing this.  Her most recent echocardiogram is normal.  But we still do not have to monitor the patient closely.  Have educated she and her husband in the office today to watch for signs of lower extremity swelling, dyspnea at rest, orthopnea, PND, syncope or chest discomfort to notify me.  EKG done in office today sinus rhythm.   Patient Instructions  Medication Instructions:  Your physician recommends that you continue on your current medications as directed. Please refer to the Current Medication list given to you today.    *If you need a refill on your cardiac medications before your next appointment, please call your pharmacy*   Lab Work: NONE    If you have labs (blood work) drawn today and your tests are completely normal, you will receive your results only by: MyChart Message (if you have MyChart) OR A paper copy in the mail If you have any lab test that is abnormal or we need to change your treatment, we will call you to review the results.   Testing/Procedures: NONE    Follow-Up: At [facility], you and your health needs are our priority.  As part of our continuing mission to provide you with exceptional heart care, we have created designated Provider Care Teams.  These Care Teams include your primary Cardiologist (physician) and Advanced Practice Providers (APPs -  Physician Assistants and Nurse Practitioners) who all work together to provide you with the care you need, when you need it.  We recommend signing up for the patient portal called "MyChart".  Sign up information is provided on this After Visit  Summary.  MyChart is used to connect with patients for Virtual Visits (Telemedicine).  Patients are able to view lab/test results, encounter notes, upcoming appointments, etc.  Non-urgent messages can be sent to your provider as well.   To learn more about what you  can do with MyChart, go to [email or web address].    Your next appointment:   6-8 week(s)  The format for your next appointment:   In Person  Provider:   Texas Souter, MD    Other Instructions     Dispo:  No follow-ups on file.   Medication Adjustments/Labs and Tests Ordered: Current medicines are reviewed at length with the patient today.  Concerns regarding medicines are outlined above.  Tests Ordered: Orders Placed This Encounter  Procedures   EKG 12-Lead   Medication Changes: No orders of the defined types were placed in this encounter.

## 2024-05-30 NOTE — Patient Instructions (Signed)
 Medication Instructions:  Your physician recommends that you continue on your current medications as directed. Please refer to the Current Medication list given to you today.    *If you need a refill on your cardiac medications before your next appointment, please call your pharmacy*   Lab Work: NONE    If you have labs (blood work) drawn today and your tests are completely normal, you will receive your results only by: MyChart Message (if you have MyChart) OR A paper copy in the mail If you have any lab test that is abnormal or we need to change your treatment, we will call you to review the results.   Testing/Procedures: NONE    Follow-Up: At [facility], you and your health needs are our priority.  As part of our continuing mission to provide you with exceptional heart care, we have created designated Provider Care Teams.  These Care Teams include your primary Cardiologist (physician) and Advanced Practice Providers (APPs -  Physician Assistants and Nurse Practitioners) who all work together to provide you with the care you need, when you need it.  We recommend signing up for the patient portal called "MyChart".  Sign up information is provided on this After Visit Summary.  MyChart is used to connect with patients for Virtual Visits (Telemedicine).  Patients are able to view lab/test results, encounter notes, upcoming appointments, etc.  Non-urgent messages can be sent to your provider as well.   To learn more about what you can do with MyChart, go to [email or web address].    Your next appointment:   6-8 week(s)  The format for your next appointment:   In Person  Provider:   Kardie Tobb, MD    Other Instructions

## 2024-06-19 ENCOUNTER — Inpatient Hospital Stay
Admission: EM | Admit: 2024-06-19 | Discharge: 2024-06-21 | DRG: 807 | Disposition: A | Discharge status: Home / Self Care | Attending: Obstetrics | Admitting: Obstetrics

## 2024-06-19 ENCOUNTER — Encounter: Payer: Self-pay | Admitting: Obstetrics and Gynecology

## 2024-06-19 ENCOUNTER — Observation Stay
Admission: EM | Admit: 2024-06-19 | Discharge: 2024-06-19 | Disposition: A | Discharge status: Home / Self Care | Attending: Obstetrics and Gynecology | Admitting: Obstetrics and Gynecology

## 2024-06-19 ENCOUNTER — Other Ambulatory Visit: Payer: Self-pay

## 2024-06-19 DIAGNOSIS — R109 Unspecified abdominal pain: Secondary | ICD-10-CM | POA: Diagnosis not present

## 2024-06-19 DIAGNOSIS — Z3A39 39 weeks gestation of pregnancy: Secondary | ICD-10-CM | POA: Diagnosis not present

## 2024-06-19 DIAGNOSIS — O99824 Streptococcus B carrier state complicating childbirth: Secondary | ICD-10-CM | POA: Diagnosis not present

## 2024-06-19 DIAGNOSIS — O09892 Supervision of other high risk pregnancies, second trimester: Secondary | ICD-10-CM | POA: Diagnosis not present

## 2024-06-19 DIAGNOSIS — O26893 Other specified pregnancy related conditions, third trimester: Secondary | ICD-10-CM | POA: Diagnosis not present

## 2024-06-19 DIAGNOSIS — O471 False labor at or after 37 completed weeks of gestation: Principal | ICD-10-CM | POA: Insufficient documentation

## 2024-06-19 DIAGNOSIS — Z3A40 40 weeks gestation of pregnancy: Secondary | ICD-10-CM | POA: Diagnosis not present

## 2024-06-19 DIAGNOSIS — Z8249 Family history of ischemic heart disease and other diseases of the circulatory system: Secondary | ICD-10-CM

## 2024-06-19 DIAGNOSIS — O0993 Supervision of high risk pregnancy, unspecified, third trimester: Secondary | ICD-10-CM | POA: Diagnosis not present

## 2024-06-19 DIAGNOSIS — O99891 Other specified diseases and conditions complicating pregnancy: Secondary | ICD-10-CM | POA: Diagnosis not present

## 2024-06-19 LAB — CBC
HCT: 35 % — ABNORMAL LOW (ref 36.0–46.0)
Hemoglobin: 11.5 g/dL — ABNORMAL LOW (ref 12.0–15.0)
MCH: 30.2 pg (ref 26.0–34.0)
MCHC: 32.9 g/dL (ref 30.0–36.0)
MCV: 91.9 fL (ref 80.0–100.0)
Platelets: 262 10*3/uL (ref 150–400)
RBC: 3.81 MIL/uL — ABNORMAL LOW (ref 3.87–5.11)
RDW: 13.2 % (ref 11.5–15.5)
WBC: 14.5 10*3/uL — ABNORMAL HIGH (ref 4.0–10.5)
nRBC: 0 % (ref 0.0–0.2)

## 2024-06-19 MED ORDER — LACTATED RINGERS IV SOLN: INTRAVENOUS | Status: DC

## 2024-06-19 MED ORDER — LIDOCAINE HCL (PF) 1 % IJ SOLN: 30.0000 mL | INTRAMUSCULAR | Status: DC | PRN

## 2024-06-19 MED ORDER — OXYTOCIN BOLUS FROM INFUSION
333.0000 mL | Freq: Once | INTRAVENOUS | Status: AC
  Administered 2024-06-20: 333 mL via INTRAVENOUS

## 2024-06-19 MED ORDER — LIDOCAINE HCL (PF) 1 % IJ SOLN
INTRAMUSCULAR | Status: AC
  Filled 2024-06-19: qty 30

## 2024-06-19 MED ORDER — TERBUTALINE SULFATE 1 MG/ML IJ SOLN
0.2500 mg | Freq: Once | INTRAMUSCULAR | Status: DC | PRN
Start: 2024-06-19 — End: 2024-06-20

## 2024-06-19 MED ORDER — FENTANYL CITRATE (PF) 100 MCG/2ML IJ SOLN
25.0000 ug | INTRAMUSCULAR | Status: DC | PRN
  Administered 2024-06-20: 25 ug via INTRAVENOUS
  Filled 2024-06-19: qty 2

## 2024-06-19 MED ORDER — ONDANSETRON HCL 4 MG/2ML IJ SOLN
4.0000 mg | Freq: Four times a day (QID) | INTRAMUSCULAR | Status: DC | PRN
  Administered 2024-06-20: 4 mg via INTRAVENOUS
  Filled 2024-06-19: qty 2

## 2024-06-19 MED ORDER — LACTATED RINGERS IV SOLN
500.0000 mL | INTRAVENOUS | Status: DC | PRN
  Administered 2024-06-20: 500 mL via INTRAVENOUS

## 2024-06-19 MED ORDER — OXYTOCIN-SODIUM CHLORIDE 30-0.9 UT/500ML-% IV SOLN
2.5000 [IU]/h | INTRAVENOUS | Status: DC
  Filled 2024-06-19: qty 500

## 2024-06-19 MED ORDER — SOD CITRATE-CITRIC ACID 500-334 MG/5ML PO SOLN: 30.0000 mL | ORAL | Status: DC | PRN

## 2024-06-19 MED ORDER — OXYTOCIN 10 UNIT/ML IJ SOLN
INTRAMUSCULAR | Status: DC
Start: 2024-06-19 — End: 2024-06-20
  Filled 2024-06-19: qty 2

## 2024-06-19 MED ORDER — AMMONIA AROMATIC IN INHA
RESPIRATORY_TRACT | Status: AC
  Filled 2024-06-19: qty 10

## 2024-06-19 MED ORDER — PENICILLIN G POT IN DEXTROSE 60000 UNIT/ML IV SOLN
3.0000 10*6.[IU] | INTRAVENOUS | Status: DC
  Administered 2024-06-20: 3 10*6.[IU] via INTRAVENOUS
  Filled 2024-06-19 (×5): qty 50

## 2024-06-19 MED ORDER — OXYTOCIN-SODIUM CHLORIDE 30-0.9 UT/500ML-% IV SOLN
1.0000 m[IU]/min | INTRAVENOUS | Status: DC
  Administered 2024-06-20: 2 m[IU]/min via INTRAVENOUS

## 2024-06-19 MED ORDER — PRENATAL PLUS 27-1 MG PO TABS: 1.0000 | ORAL_TABLET | Freq: Every day | ORAL | Status: DC

## 2024-06-19 MED ORDER — OXYTOCIN 10 UNIT/ML IJ SOLN: 10.0000 [IU] | Freq: Once | INTRAMUSCULAR | Status: DC

## 2024-06-19 MED ORDER — MISOPROSTOL 200 MCG PO TABS
ORAL_TABLET | ORAL | Status: AC
  Filled 2024-06-19: qty 4

## 2024-06-19 MED ORDER — FENTANYL CITRATE (PF) 100 MCG/2ML IJ SOLN: 25.0000 ug | Freq: Once | INTRAMUSCULAR | Status: DC

## 2024-06-19 MED ORDER — SODIUM CHLORIDE 0.9 % IV SOLN
5.0000 10*6.[IU] | Freq: Once | INTRAVENOUS | Status: AC
  Administered 2024-06-20: 5 10*6.[IU] via INTRAVENOUS
  Filled 2024-06-19: qty 5

## 2024-06-19 NOTE — H&P (Addendum)
 OB History & Physical   History of Present Illness:  Chief Complaint: regular uterine contractions  HPI:  SHALONDRA WUNSCHEL is a 23 y.o. G1P0 female at [redacted]w[redacted]d dated by LMP consistent with a 6 week ultrasound.  Her pregnancy has been complicated by a family history of cardiac disease with postpartum cardiomyopathy in family members.  She has had an essentially normal echo this pregnancy. .    She reports contractions have become closer together, as close as 3 minutes apart.   She denies leakage of fluid.   She denies vaginal bleeding.   She reports fetal movement.    Total weight gain for pregnancy: 28.6 kg   Maternal Medical History:  History reviewed. No pertinent past medical history.  Past Surgical History:  Procedure Laterality Date   NO PAST SURGERIES      No Known Allergies  Prior to Admission medications   Medication Sig Start Date End Date Taking? Authorizing Provider  prenatal vitamin w/FE, FA (PRENATAL 1 + 1) 27-1 MG TABS tablet Take 1 tablet by mouth daily at 12 noon.   Yes [provider]    OB History  Gravida Para Term Preterm AB Living  1       SAB IAB Ectopic Multiple Live Births          # Outcome Date GA Lbr Len/2nd Weight Sex Type Anes PTL Lv  1 Current             Prenatal care site: [facility]  Social History: She  reports that she has never smoked. She has never used smokeless tobacco. She reports that she does not drink alcohol and does not use drugs.  Family History: family history includes Cancer in her mother; Heart disease in her father; Heart failure in her brother, father, and sister.   Review of Systems  Constitutional: Negative.   HENT: Negative.    Eyes: Negative.   Respiratory: Negative.    Cardiovascular: Negative.   Gastrointestinal:  Positive for abdominal pain.  Genitourinary: Negative.   Musculoskeletal: Negative.   Skin: Negative.   Neurological: Negative.   Psychiatric/Behavioral: Negative.       Physical Exam:   Temp 97.7 F (36.5 C) (Oral)   Resp 16   Ht 5' 10 (1.778 m)   Wt 84.4 kg   LMP 09/14/2023   BMI 26.69 kg/m   Physical Exam Constitutional:      General: She is not in acute distress.    Appearance: Normal appearance. She is well-developed.  Genitourinary:     Genitourinary Comments: SVE: 4/80/0  HENT:     Head: Normocephalic and atraumatic.   Eyes:     General: No scleral icterus.    Conjunctiva/sclera: Conjunctivae normal.    Cardiovascular:     Rate and Rhythm: Normal rate and regular rhythm.     Heart sounds: No murmur heard.    No friction rub. No gallop.  Pulmonary:     Effort: Pulmonary effort is normal. No respiratory distress.     Breath sounds: Normal breath sounds. No wheezing or rales.  Abdominal:     General: Bowel sounds are normal. There is no distension.     Palpations: Abdomen is soft. There is mass (gravid).     Tenderness: There is no abdominal tenderness. There is no guarding or rebound.   Musculoskeletal:        General: Normal range of motion.     Cervical back: Normal range of motion and neck supple.  Neurological:     General: No focal deficit present.     Mental Status: She is alert and oriented to person, place, and time.     Cranial Nerves: No cranial nerve deficit.   Skin:    General: Skin is warm and dry.     Findings: No erythema.   Psychiatric:        Mood and Affect: Mood normal.        Behavior: Behavior normal.        Judgment: Judgment normal.    Female chaperone present for pelvic exam: Catarina Hope, RN  Pertinent Results:   Blood type/Rh O positive  Antibody screen negative  Rubella Immune (11/24/2023)  Varicella Immune (11/24/2023)    RPR NR (11/24/2023, 03/28/2024)  HBsAg Negative (11/24/2023)  HIV Negative (11/24/2023, 03/28/2024)  GC Negative (05/24/2024)  Chlamydia Negative (05/24/2024)  Genetic screening Low risk NIPS  1 hour GTT 115  3 hour GTT N/a  GBS positive on 05/24/2024   Baseline FHR: 135 beats/min    Variability: moderate   Accelerations: present   Decelerations: absent Contractions: present frequency: irregular, not tracing well Overall assessment: cat 1  Fetus cephalic by bedside ultrasound EFW: 7#8oz  Assessment:  YEIRA GULDEN is a 23 y.o. G1P0 female at [redacted]w[redacted]d with normal labor.   Plan:  1. Admit to Labor & Delivery - Admission status: Inpatient - Reason for admission: labor management - consents reviewed and obtained  2. Fetal Well being  - Fetal Tracing: category 1 - Group B Streptococcus ppx is indicated: GBS positive - Presentation: cephalic confirmed by SVE   3. Routine OB: - Prenatal labs reviewed, as above - Rh positive - CBC, T&S, RPR on admit - Clear liquid diet , continuous IV fluids  4. Monitoring of labor  - Contractions monitored with external toco - Pelvis unproven but appears to be adequate for trial of labor  - Plan for expectant management  - Plan for  continuous fetal monitoring - Maternal pain control as desired; planning regional anesthesia, IVPM, and per patient request - Anticipate vaginal delivery  5.  Post Partum Planning: - Infant feeding: breast feeding - Contraception: oral progesterone-only contraceptive - Flu vaccine: received through work - Tdap vaccine: Declined 03/28/24  - RSV vaccine: Out of season   Garnette Mace, MD 06/19/2024 11:06 PM

## 2024-06-19 NOTE — Discharge Summary (Signed)
 Physician Final Progress Note  Patient ID: Brenda Medina MRN: [id number] DOB/AGE: 2001-07-08 23 y.o.  Admit date: 06/19/2024 Admitting provider: Garnette JONETTA Mace, MD Discharge date: 06/19/2024   Admission Diagnoses:  1) intrauterine pregnancy at [redacted]w[redacted]d  2) uterine contractions  Discharge Diagnoses:  Principal Problem:   Abdominal pain during pregnancy in third trimester  Contractions, false labor  History of Present Illness: The patient is a 23 y.o. female G1P0 at [redacted]w[redacted]d who presents for contractions that have been off and on, but worse today since her clinic appointment. She notes no vaginal bleeding, no fluid leaking. She notes +FM.SABRA   History reviewed. No pertinent past medical history.  Past Surgical History:  Procedure Laterality Date   NO PAST SURGERIES      No current facility-administered medications on file prior to encounter.   Current Outpatient Medications on File Prior to Encounter  Medication Sig Dispense Refill   prenatal vitamin w/FE, FA (PRENATAL 1 + 1) 27-1 MG TABS tablet Take 1 tablet by mouth daily at 12 noon.      No Known Allergies  Social History   Socioeconomic History   Marital status: Married    Spouse name: Not on file   Number of children: Not on file   Years of education: Not on file   Highest education level: Not on file  Occupational History   Not on file  Tobacco Use   Smoking status: Never   Smokeless tobacco: Never  Vaping Use   Vaping status: Never Used  Substance and Sexual Activity   Alcohol use: Never   Drug use: Never   Sexual activity: Yes    Birth control/protection: Pill  Other Topics Concern   Not on file  Social History Narrative   Not on file   Social Drivers of Health   Financial Resource Strain: Not on file  Food Insecurity: Not on file  Transportation Needs: Not on file  Physical Activity: Not on file  Stress: Not on file  Social Connections: Not on file  Intimate Partner Violence: Not on file     Family History  Problem Relation Age of Onset   Cancer Mother    Heart failure Father    Heart disease Father    Heart failure Sister    Heart failure Brother    Asthma Neg Hx    Hypertension Neg Hx      Review of Systems  Gastrointestinal:  Positive for abdominal pain.     Physical Exam: BP 125/66 (BP Location: Left Arm)   Pulse 75   Temp 98.2 F (36.8 C) (Oral)   Resp 18   Ht 5' 10 (1.778 m)   Wt 84.8 kg   LMP 09/14/2023   BMI 26.83 kg/m   Physical Exam Constitutional:      General: She is not in acute distress.    Appearance: Normal appearance.  Genitourinary:     Genitourinary Comments: Cervix: 4-4.5/80/-1 per RN  x 2  HENT:     Head: Normocephalic and atraumatic.   Eyes:     General: No scleral icterus.    Conjunctiva/sclera: Conjunctivae normal.    Neurological:     General: No focal deficit present.     Mental Status: She is alert and oriented to person, place, and time.     Cranial Nerves: No cranial nerve deficit.   Psychiatric:        Mood and Affect: Mood normal.        Behavior: Behavior normal.  Judgment: Judgment normal.     Consults: None  Significant Findings/ Diagnostic Studies: none  Procedures:  NST: Baseline FHR: 135 beats/min Variability: moderate Accelerations: present Decelerations: absent Tocometry: irregular 2-3 q 10 min  Interpretation:  INDICATIONS: rule out uterine contractions RESULTS:  A NST procedure was performed with FHR monitoring and a normal baseline established, appropriate time of 20-40 minutes of evaluation, and accels >2 seen w 15x15 characteristics.  Results show a REACTIVE NST.    Hospital Course: The patient was admitted to Labor and Delivery Triage for observation. She had no significant cervical change over her time in triage. Her exam was similar to her exam in clinic this morning. Her vitals were normal, the fetal tracing was reactive. She was not in labor. She was offered a longer  observation time. She declined in favor of laboring more at home.   Discharge Condition: stable  Disposition: Discharge disposition: 01-Home or Self Care       Diet: Regular diet  Discharge Activity: Activity as tolerated   Allergies as of 06/19/2024   No Known Allergies      Medication List     TAKE these medications    prenatal vitamin w/FE, FA 27-1 MG Tabs tablet Take 1 tablet by mouth daily at 12 noon.         Total time spent taking care of this patient: 25 minutes  Signed: Garnette Mace, MD  06/19/2024, 7:13 PM

## 2024-06-19 NOTE — Progress Notes (Signed)
 Pt presents to L/D triage with reported contractions that began to worsen in intensity and frequency an hour after her membrane sweep in the office-around 9:30.  Pt reports some bloody show, no SROM. Pt reports positive fetal movement and feeling contractions every 5 minutes.  Monitors applied and assessing. Initial FHT 140.  SVE 4/70-80/-1.  Pt po hydrating.  MD notified of patient's arrival to unit.  Plan to do a two hour labor eval.

## 2024-06-20 ENCOUNTER — Inpatient Hospital Stay: Admitting: Anesthesiology

## 2024-06-20 ENCOUNTER — Encounter: Payer: Self-pay | Admitting: Obstetrics and Gynecology

## 2024-06-20 DIAGNOSIS — Z3A40 40 weeks gestation of pregnancy: Secondary | ICD-10-CM

## 2024-06-20 LAB — TYPE AND SCREEN
ABO/RH(D): O POS
Antibody Screen: NEGATIVE

## 2024-06-20 LAB — RPR: RPR Ser Ql: NONREACTIVE

## 2024-06-20 LAB — ABO/RH: ABO/RH(D): O POS

## 2024-06-20 MED ORDER — BENZOCAINE-MENTHOL 20-0.5 % EX AERO
1.0000 | INHALATION_SPRAY | CUTANEOUS | Status: DC | PRN
  Administered 2024-06-20: 1 via TOPICAL
  Filled 2024-06-20: qty 56

## 2024-06-20 MED ORDER — SIMETHICONE 80 MG PO CHEW: 80.0000 mg | CHEWABLE_TABLET | ORAL | Status: DC | PRN

## 2024-06-20 MED ORDER — EPHEDRINE 5 MG/ML INJ: 10.0000 mg | INTRAVENOUS | Status: DC | PRN

## 2024-06-20 MED ORDER — FENTANYL-BUPIVACAINE-NACL 0.5-0.125-0.9 MG/250ML-% EP SOLN
12.0000 mL/h | EPIDURAL | Status: DC | PRN
  Administered 2024-06-20: 12 mL/h via EPIDURAL

## 2024-06-20 MED ORDER — IBUPROFEN 600 MG PO TABS
600.0000 mg | ORAL_TABLET | Freq: Four times a day (QID) | ORAL | Status: DC
  Administered 2024-06-20 – 2024-06-21 (×3): 600 mg via ORAL
  Filled 2024-06-20 (×4): qty 1

## 2024-06-20 MED ORDER — BUPIVACAINE HCL (PF) 0.25 % IJ SOLN
INTRAMUSCULAR | Status: DC | PRN
  Administered 2024-06-20 (×2): 4 mL via EPIDURAL

## 2024-06-20 MED ORDER — LACTATED RINGERS IV SOLN: 500.0000 mL | Freq: Once | INTRAVENOUS | Status: DC

## 2024-06-20 MED ORDER — FENTANYL-BUPIVACAINE-NACL 0.5-0.125-0.9 MG/250ML-% EP SOLN
EPIDURAL | Status: AC
  Filled 2024-06-20: qty 250

## 2024-06-20 MED ORDER — DIPHENHYDRAMINE HCL 25 MG PO CAPS: 25.0000 mg | ORAL_CAPSULE | Freq: Four times a day (QID) | ORAL | Status: DC | PRN

## 2024-06-20 MED ORDER — PHENYLEPHRINE 80 MCG/ML (10ML) SYRINGE FOR IV PUSH (FOR BLOOD PRESSURE SUPPORT): 80.0000 ug | PREFILLED_SYRINGE | INTRAVENOUS | Status: DC | PRN

## 2024-06-20 MED ORDER — WITCH HAZEL-GLYCERIN EX PADS
1.0000 | MEDICATED_PAD | CUTANEOUS | Status: DC | PRN
  Administered 2024-06-20: 1 via TOPICAL
  Filled 2024-06-20: qty 100

## 2024-06-20 MED ORDER — DIBUCAINE (PERIANAL) 1 % EX OINT: 1.0000 | TOPICAL_OINTMENT | CUTANEOUS | Status: DC | PRN

## 2024-06-20 MED ORDER — COCONUT OIL OIL
1.0000 | TOPICAL_OIL | Status: DC | PRN
  Filled 2024-06-20: qty 7.5

## 2024-06-20 MED ORDER — ONDANSETRON HCL 4 MG PO TABS: 4.0000 mg | ORAL_TABLET | ORAL | Status: DC | PRN

## 2024-06-20 MED ORDER — DIPHENHYDRAMINE HCL 50 MG/ML IJ SOLN: 12.5000 mg | INTRAMUSCULAR | Status: DC | PRN

## 2024-06-20 MED ORDER — ACETAMINOPHEN 325 MG PO TABS: 650.0000 mg | ORAL_TABLET | ORAL | Status: DC | PRN

## 2024-06-20 MED ORDER — LIDOCAINE-EPINEPHRINE (PF) 1.5 %-1:200000 IJ SOLN
INTRAMUSCULAR | Status: DC | PRN
  Administered 2024-06-20: 3 mL via EPIDURAL

## 2024-06-20 MED ORDER — LIDOCAINE HCL (PF) 1 % IJ SOLN
INTRAMUSCULAR | Status: DC | PRN
Start: 2024-06-20 — End: 2024-06-20
  Administered 2024-06-20: 3 mL via EPIDURAL

## 2024-06-20 MED ORDER — SENNOSIDES-DOCUSATE SODIUM 8.6-50 MG PO TABS
2.0000 | ORAL_TABLET | ORAL | Status: DC
Start: 2024-06-20 — End: 2024-06-21
  Administered 2024-06-20: 2 via ORAL
  Filled 2024-06-20: qty 2

## 2024-06-20 MED ORDER — PRENATAL MULTIVITAMIN CH
1.0000 | ORAL_TABLET | Freq: Every day | ORAL | Status: DC
  Administered 2024-06-20 – 2024-06-21 (×2): 1 via ORAL
  Filled 2024-06-20 (×2): qty 1

## 2024-06-20 MED ORDER — ONDANSETRON HCL 4 MG/2ML IJ SOLN: 4.0000 mg | INTRAMUSCULAR | Status: DC | PRN

## 2024-06-20 MED ORDER — FERROUS SULFATE 325 (65 FE) MG PO TABS
325.0000 mg | ORAL_TABLET | Freq: Two times a day (BID) | ORAL | Status: DC
  Administered 2024-06-20 – 2024-06-21 (×2): 325 mg via ORAL
  Filled 2024-06-20: qty 1

## 2024-06-20 NOTE — Lactation Note (Signed)
 This note was copied from a baby's chart. Lactation Consultation Note  Patient Name: Brenda Medina Date: 06/20/2024 Age:23 hours Reason for consult: Primapara;Difficult latch;Follow-up assessment   Maternal Data    Feeding Mother's Current Feeding Choice: Breast Milk  LATCH Score                    Lactation Tools Discussed/Used Tools: Nipple Shields Nipple shield size: 20  Interventions Interventions: Breast feeding basics reviewed;DEBP;Education;CDC milk storage guidelines;CDC Guidelines for Breast Pump Cleaning Mom had just fed baby for 12 minutes when LC arrived. Mom states that she feels comfortable using the nipple shield. Handout about nipple shields given. LC followed up with day-shift LC's recommendation to use a DEBP for approximately 10-15 minutes following several feedings each day while using the shield until milk supply is established. Mom has a manual, a Medela electric, and a momcozy wearable pump at home. Mom used DEBP for 10 minutes with 24mm flanges and yielded 1.44mL colostrum which was collected with a syringe and placed at bedside for mom to drip on shield at the beginning of the next feed, syringe feed at next feeding, or label and send to the nursery for refrigeration. Milk storage and pump cleaning recommendations reviewed with parents.  RN updated on the plan of care. Discharge Pump: DEBP  Consult Status      Brenda Medina 06/20/2024, 8:53 PM

## 2024-06-20 NOTE — Discharge Summary (Signed)
 Postpartum Discharge Summary  Patient Name: Brenda Medina DOB: 2001-11-08 MRN: [id number]  Date of admission: 06/19/2024 Delivery date:06/20/2024 Delivering provider: JACKSON, STEPHEN D Date of discharge: 06/21/2024  Primary OB: [facility] OFE:Ejupzwu'd last menstrual period was 09/14/2023. EDC Estimated Date of Delivery: 06/20/24 Gestational Age at Delivery: [redacted]w[redacted]d   Admitting diagnosis: Normal labor [O80, Z37.9] Intrauterine pregnancy: [redacted]w[redacted]d     Secondary diagnosis:   Principal Problem:   Normal labor Active Problems:   Family history of cardiomyopathy-pts brother died of CHF at age 50   [redacted] weeks gestation of pregnancy   Discharge Diagnosis: Term Pregnancy Delivered      Hospital course: Onset of Labor With Vaginal Delivery      23 y.o. yo G1P1001 at [redacted]w[redacted]d was admitted in Active Labor on 06/19/2024. Labor course was complicated by nothing.  Membrane Rupture Time/Date: 12:10 AM,06/20/2024  Delivery Method:Vaginal, Spontaneous Operative Delivery:N/A Episiotomy: None Lacerations:  None Patient had a postpartum course without complication.  She is ambulating, tolerating a regular diet, passing flatus, and urinating well. Patient is discharged home in stable condition on 06/21/24.  Newborn Data: Birth date:06/20/2024 Birth time:7:04 AM Gender:Female Brenda Medina  Living status:Living Apgars:8 ,9  Weight:3300 g                                            Post partum procedures:none Augmentation:: Pitocin  Complications: None Delivery Type: spontaneous vaginal delivery Anesthesia: epidural anesthesia Placenta: spontaneous To Pathology: No   Prenatal Labs:   Blood type/Rh O positive  Antibody screen negative  Rubella Immune (11/24/2023)  Varicella Immune (11/24/2023)    RPR NR (11/24/2023, 03/28/2024)  HBsAg Negative (11/24/2023)  HIV Negative (11/24/2023, 03/28/2024)  GC Negative (05/24/2024)  Chlamydia Negative (05/24/2024)  Genetic screening Low risk NIPS  1 hour GTT 115  3  hour GTT N/a  GBS positive on 05/24/2024    Magnesium Sulfate received: No BMZ received: No Rhophylac:was not indicated MMR: was not indicated Varivax vaccine given: was not indicated - Tdap vaccine: declined - Flu vaccine: prenatally given through work -RSV vaccine:out of season  Transfusion:No  Physical exam  Vitals:   06/20/24 1618 06/20/24 2000 06/21/24 0038 06/21/24 0812  BP: (!) 104/59 (!) 117/54 113/65 114/68  Pulse: 77 70 74 74  Resp: 18 17 17 18   Temp: 98 F (36.7 C) 99.9 F (37.7 C) 98.5 F (36.9 C) 98.1 F (36.7 C)  TempSrc: Oral Oral Oral Oral  SpO2: 100% 100% 98% 98%  Weight:      Height:       General: alert, cooperative, and no distress Lochia: appropriate Uterine Fundus: firm Perineum: minimal edema/intact DVT Evaluation: No evidence of DVT seen on physical exam.  Labs: Lab Results  Component Value Date   WBC 13.0 (H) 06/21/2024   HGB 10.2 (L) 06/21/2024   HCT 32.3 (L) 06/21/2024   MCV 92.3 06/21/2024   PLT 239 06/21/2024      Latest Ref Rng & Units 06/02/2022   11:31 AM  CMP  Glucose 70 - 99 mg/dL 85   BUN 6 - 20 mg/dL 5   Creatinine 9.42 - 8.99 mg/dL 9.20   Sodium 865 - 855 mmol/L 139   Potassium 3.5 - 5.2 mmol/L 4.0   Chloride 96 - 106 mmol/L 101   CO2 20 - 29 mmol/L 22   Calcium 8.7 - 10.2 mg/dL 10.1  Edinburgh Score:    06/20/2024   11:00 AM  Edinburgh Postnatal Depression Scale Screening Tool  I have been able to laugh and see the funny side of things. 0  I have looked forward with enjoyment to things. 0  I have blamed myself unnecessarily when things went wrong. 0  I have been anxious or worried for no good reason. 0  I have felt scared or panicky for no good reason. 0  Things have been getting on top of me. 1  I have been so unhappy that I have had difficulty sleeping. 0  I have felt sad or miserable. 0  I have been so unhappy that I have been crying. 0  The thought of harming myself has occurred to me. 0  Edinburgh  Postnatal Depression Scale Total 1    Risk assessment for postpartum VTE and prophylactic treatment: Very high risk factors: If any risk factors: 6 weeks LMHW and None High risk factors: If 1 risk factor, mechanical prophylaxis and early ambulation , If > 1 risk factor OR 1 risk factor + 1 moderate risk factor: 3-6 weeks of LMWH, and None Moderate risk factors: If 3 or more risk factors: mechanical prophylaxis and early ambulation OR 3-6 weeks of LMWH and None  Postpartum VTE prophylaxis with LMWH not indicated  After visit meds:  Allergies as of 06/21/2024   No Known Allergies      Medication List     TAKE these medications    acetaminophen  325 MG tablet Commonly known as: Tylenol  Take 2 tablets (650 mg total) by mouth every 6 (six) hours as needed for mild pain (pain score 1-3) or fever (for pain scale < 4).   benzocaine -Menthol  20-0.5 % Aero Commonly known as: DERMOPLAST Apply 1 Application topically as needed for irritation (perineal discomfort).   ferrous sulfate  325 (65 FE) MG tablet Take 1 tablet (325 mg total) by mouth 2 (two) times daily with a meal.   ibuprofen  600 MG tablet Commonly known as: ADVIL  Take 1 tablet (600 mg total) by mouth every 6 (six) hours as needed for fever, mild pain (pain score 1-3) or cramping.   prenatal vitamin w/FE, FA 27-1 MG Tabs tablet Take 1 tablet by mouth daily at 12 noon.   witch hazel-glycerin  pad Commonly known as: TUCKS Apply 1 Application topically as needed for hemorrhoids.       Discharge home in stable condition Infant Feeding: Breast Infant Disposition:home with mother Discharge instruction: per After Visit Summary and Postpartum booklet. Activity: Advance as tolerated. Pelvic rest for 6 weeks.  Diet: routine diet Anticipated Birth Control:  Contraceptives: Progesterone only pills Postpartum Appointment:6 weeks Additional Postpartum F/U: call her cardiologist for postpartum echocardiogram and follow-up Future  Appointments: Future Appointments  Date Time Provider Department Center  08/11/2024  3:00 PM Tobb, Kardie, DO [facility] None   Follow up Visit:  Follow-up Information     Leonce Garnette BIRCH, MD. Schedule an appointment as soon as possible for a visit in 6 week(s).   Specialty: Obstetrics and Gynecology Why: Six weeks postpartum visit Contact information: [address] [place] [address] [phone]         Sheena Pugh, DO. Call.   Specialty: Cardiology Why: follow-up after delivery Contact information: [address] [place] [address] [phone]                 Plan:  SHATARRA WEHLING was discharged to home in good condition. Follow-up appointment as directed.  Signed:  Edsel Charlies Blush, CNM 06/21/2024 9:20 AM

## 2024-06-20 NOTE — Anesthesia Preprocedure Evaluation (Signed)
 Anesthesia Evaluation  Patient identified by MRN, date of birth, ID band Patient awake    Reviewed: Allergy & Precautions, NPO status , Patient's Chart, lab work & pertinent test results  History of Anesthesia Complications Negative for: history of anesthetic complications  Airway Mallampati: III  TM Distance: >3 FB Neck ROM: full    Dental  (+) Chipped   Pulmonary neg pulmonary ROS   Pulmonary exam normal        Cardiovascular Exercise Tolerance: Good (-) hypertensionnegative cardio ROS Normal cardiovascular exam     Neuro/Psych    GI/Hepatic negative GI ROS,,,  Endo/Other    Renal/GU   negative genitourinary   Musculoskeletal   Abdominal   Peds  Hematology negative hematology ROS (+)   Anesthesia Other Findings History reviewed. No pertinent past medical history.  Past Surgical History: No date: NO PAST SURGERIES  BMI    Body Mass Index: 26.69 kg/m      Reproductive/Obstetrics (+) Pregnancy                             Anesthesia Physical Anesthesia Plan  ASA: 2  Anesthesia Plan: Epidural   Post-op Pain Management:    Induction:   PONV Risk Score and Plan:   Airway Management Planned: Natural Airway  Additional Equipment:   Intra-op Plan:   Post-operative Plan:   Informed Consent: I have reviewed the patients History and Physical, chart, labs and discussed the procedure including the risks, benefits and alternatives for the proposed anesthesia with the patient or authorized representative who has indicated his/her understanding and acceptance.     Dental Advisory Given  Plan Discussed with: Anesthesiologist  Anesthesia Plan Comments: (Patient reports no bleeding problems and no anticoagulant use.   Patient consented for risks of anesthesia including but not limited to:  - adverse reactions to medications - risk of bleeding, infection and or nerve damage  from epidural that could lead to paralysis - risk of headache or failed epidural - nerve damage due to positioning - that if epidural is used for C-section that there is a chance of epidural failure requiring spinal placement or conversion to GA - Damage to heart, brain, lungs, other parts of body or loss of life  Patient voiced understanding and assent.)       Anesthesia Quick Evaluation

## 2024-06-20 NOTE — Anesthesia Procedure Notes (Signed)
 Epidural Patient location during procedure: OB Start time: 06/20/2024 2:46 AM End time: 06/20/2024 2:51 AM  Staffing Anesthesiologist: Lathan Gieselman, Fairy POUR, MD Performed: anesthesiologist   Preanesthetic Checklist Completed: patient identified, IV checked, site marked, risks and benefits discussed, surgical consent, monitors and equipment checked, pre-op evaluation and timeout performed  Epidural Patient position: sitting Prep: ChloraPrep Patient monitoring: heart rate, continuous pulse ox and blood pressure Approach: midline Location: L3-L4 Injection technique: LOR saline  Needle:  Needle type: Tuohy  Needle gauge: 17 G Needle length: 9 cm and 9 Needle insertion depth: 5 cm Catheter type: closed end flexible Catheter size: 19 Gauge Catheter at skin depth: 10 cm Test dose: negative and 1.5% lidocaine with Epi 1:200 K  Assessment Sensory level: T10 Events: blood not aspirated, no cerebrospinal fluid, injection not painful, no injection resistance, no paresthesia and negative IV test  Additional Notes 1 attempt Pt. Evaluated and documentation done after procedure finished. Patient identified. Risks/Benefits/Options discussed with patient including but not limited to bleeding, infection, nerve damage, paralysis, failed block, incomplete pain control, headache, blood pressure changes, nausea, vomiting, reactions to medication both or allergic, itching and postpartum back pain. Confirmed with bedside nurse the patient's most recent platelet count. Confirmed with patient that they are not currently taking any anticoagulation, have any bleeding history or any family history of bleeding disorders. Patient expressed understanding and wished to proceed. All questions were answered. Sterile technique was used throughout the entire procedure. Please see nursing notes for vital signs. Test dose was given through epidural catheter and negative prior to continuing to dose epidural or start  infusion. Warning signs of high block given to the patient including shortness of breath, tingling/numbness in hands, complete motor block, or any concerning symptoms with instructions to call for help. Patient was given instructions on fall risk and not to get out of bed. All questions and concerns addressed with instructions to call with any issues or inadequate analgesia.    Patient tolerated the insertion well without immediate complications.Reason for block:procedure for pain

## 2024-06-20 NOTE — Lactation Note (Signed)
 This note was copied from a baby's chart. Lactation Consultation Note  Patient Name: Girl Kadince Boxley Unijb'd Date: 06/20/2024 Age:23 hours Reason for consult: Initial assessment;Mother's request;Primapara;Term;Breastfeeding assistance   Maternal Data Has patient been taught Hand Expression?: Yes Does the patient have breastfeeding experience prior to this delivery?: No  Initial assessment w/ P1 parents and 4hr old baby girl.  This was a SVD.  Patient w/ a hx of cardiac disease w/ postpartum cardiomyopathy in family member.    Patient stated that she has 2 breastpumps at home, an electric and a manual pump.  Mom stated that she was concerned with infant not eating and she feels like she is starving her baby.   Feeding Mother's Current Feeding Choice: Breast Milk  Infant was aroused and placed at the breast.  Infant showed no interest in eating after several attempts were made to get infant on the breast.  Mom has flat nipples that when compressed, go inward.    LC introduced patient to a nipple shield and taught patient how to apply it for a feeding.  LC discussed pumping at least every other feed if she ends up using the nipple shield while in the hospital to assist w/ stimulation. Patient verbalized understanding.   LATCH Score Latch: Repeated attempts needed to sustain latch, nipple held in mouth throughout feeding, stimulation needed to elicit sucking reflex.  Audible Swallowing: None  Type of Nipple: Flat  Comfort (Breast/Nipple): Soft / non-tender  Hold (Positioning): Assistance needed to correctly position infant at breast and maintain latch.  LATCH Score: 5  Lactation Tools Discussed/Used Tools: Nipple Shields Nipple shield size: 20  Interventions Interventions: Breast feeding basics reviewed;Skin to skin;Breast compression;Adjust position;Support pillows;Position options;Education  LC provided reassurance for mom in regards to feedings.  Education was then provided  on colostrum benefits, tummy size and normal feeding amts on day 1.  LC had FOB pull out postpartum booklet that outlines all of the information.    LC provided education on the following;  milk production expectations, hunger cues, day 1/2 wet/dirty diapers, hand expression, cluster feeding, benefits of STS and arousing infant for a feeding.  Lactation informed patient of feeding infant at least 8 or more times w/in a 24hr period but not exceeding 3hrs. Patient verbalized understanding.   Discharge Pump: Personal;Manual;DEBP WIC Program: No  Consult Status Consult Status: Follow-up Follow-up type: In-patient    Rasean Joos S Quantavius Humm 06/20/2024, 12:45 PM

## 2024-06-21 LAB — CBC
HCT: 32.3 % — ABNORMAL LOW (ref 36.0–46.0)
Hemoglobin: 10.2 g/dL — ABNORMAL LOW (ref 12.0–15.0)
MCH: 29.1 pg (ref 26.0–34.0)
MCHC: 31.6 g/dL (ref 30.0–36.0)
MCV: 92.3 fL (ref 80.0–100.0)
Platelets: 239 10*3/uL (ref 150–400)
RBC: 3.5 MIL/uL — ABNORMAL LOW (ref 3.87–5.11)
RDW: 13.2 % (ref 11.5–15.5)
WBC: 13 10*3/uL — ABNORMAL HIGH (ref 4.0–10.5)
nRBC: 0 % (ref 0.0–0.2)

## 2024-06-21 MED ORDER — ACETAMINOPHEN 325 MG PO TABS: 650.0000 mg | ORAL_TABLET | Freq: Four times a day (QID) | ORAL | 0 refills | Status: AC | PRN

## 2024-06-21 MED ORDER — IBUPROFEN 600 MG PO TABS: 600.0000 mg | ORAL_TABLET | Freq: Four times a day (QID) | ORAL | 0 refills | Status: AC | PRN

## 2024-06-21 MED ORDER — WITCH HAZEL-GLYCERIN EX PADS: 1.0000 | MEDICATED_PAD | CUTANEOUS | 0 refills | Status: AC | PRN

## 2024-06-21 MED ORDER — FERROUS SULFATE 325 (65 FE) MG PO TABS: 325.0000 mg | ORAL_TABLET | Freq: Two times a day (BID) | ORAL | 3 refills | Status: AC

## 2024-06-21 MED ORDER — BENZOCAINE-MENTHOL 20-0.5 % EX AERO: 1.0000 | INHALATION_SPRAY | CUTANEOUS | 0 refills | Status: AC | PRN

## 2024-06-21 NOTE — Lactation Note (Signed)
 This note was copied from a baby's chart. Lactation Consultation Note  Patient Name: Brenda Medina Date: 06/21/2024 Age:23 hours Reason for consult: Follow-up assessment;Primapara;Other (Comment) (Discharge Education)   Maternal Data Follow up assessment and discharge education w/ a 28hr old baby Brenda and parents. Mom stated that infant seems to be cluster feeding. Mom is also interested is latching infant w/o the nipple shield.   Feeding Mother's Current Feeding Choice: Breast Milk  While providing education to patient, infant latched to the rt breast.  Mom attempted without the shield but infant was not able to sustain latch.  Patient put a nipple shield on and infant latched immediately.  Audible swallows were heard.    LATCH Score Latch: Repeated attempts needed to sustain latch, nipple held in mouth throughout feeding, stimulation needed to elicit sucking reflex.  Audible Swallowing: A few with stimulation  Type of Nipple: Everted at rest and after stimulation (Short everted nipple on rt breast; flat on left)  Comfort (Breast/Nipple): Filling, red/small blisters or bruises, mild/mod discomfort  Hold (Positioning): No assistance needed to correctly position infant at breast.  LATCH Score: 7   Lactation Tools Discussed/Used Tools: Nipple Shields Nipple shield size: 20  Interventions Interventions: Breast feeding basics reviewed;Assisted with latch;Breast compression;Adjust position;Coconut oil;Comfort gels;Education;CDC milk storage guidelines;CDC Guidelines for Breast Pump Cleaning  Breast assessment done for patient.  Patient has firm breast, short nipples.  Both nipples were very red.  Mom stated that they were not sore but FOB stated that mom mentioned they were sore.  LC discussed nipple care with mom which consisted of; coconut oil, ice, and comfort gels.    LC also provided guidance and education on how to use the Medela Manual pump.  Discharge Discharge  Education: Engorgement and breast care;Warning signs for feeding baby;Outpatient recommendation  Education on engorgement prevention/treatment was discussed as well as breastmilk storage guidelines.  LC provided patient with a handout on breastmilk storage guidelines from [facility]. [facility] outpatient lactation services phone number written on the white board in the room.  Patient verbalized understanding.  LC also provided education from the postpartum book about warning signs to look for in a poor feeding.    Consult Status Consult Status: Complete Follow-up type: Call as needed    Ricky RAMAN Jazsmin Couse 06/21/2024, 12:26 PM

## 2024-06-21 NOTE — Anesthesia Postprocedure Evaluation (Signed)
 Anesthesia Post Note  Patient: Brenda Medina  Procedure(s) Performed: AN AD HOC LABOR EPIDURAL  Patient location during evaluation: Mother Baby Anesthesia Type: Epidural Level of consciousness: awake and alert and oriented Pain management: pain level controlled Vital Signs Assessment: post-procedure vital signs reviewed and stable Respiratory status: spontaneous breathing Cardiovascular status: stable Postop Assessment: patient able to bend at knees, no apparent nausea or vomiting, adequate PO intake and able to ambulate Anesthetic complications: no   No notable events documented.   Last Vitals:  Vitals:   06/20/24 2000 06/21/24 0038  BP: (!) 117/54 113/65  Pulse: 70 74  Resp: 17 17  Temp: 37.7 C 36.9 C  SpO2: 100% 98%    Last Pain:  Vitals:   06/21/24 0038  TempSrc: Oral  PainSc:                  Marticia Reifschneider

## 2024-06-21 NOTE — TOC Transition Note (Signed)
 Transition of Care [facility]) - Discharge Note   Patient Details  Name: Brenda Medina MRN: [id number] Date of Birth: 2001/01/19  [facility] CM/SW Contact:  Elouise LULLA Capri, RN 06/21/2024, 12:04 PM   Clinical Narrative:     Discharge orders noted. CM call to patient's phone; [phone] regarding pending discharge. Per patient, patient's husband, Donnice Counter will provide transportation home. Per patient, has car seat for infant in room. Per patient, has follow up appointment tomorrow.    Final next level of care: Home/Self Care Barriers to Discharge: No Barriers Identified   Patient Goals and CMS Choice    Home/self care   Discharge Placement    Home/self care            Discharge Plan and Services Additional resources added to the After Visit Summary for            DME Arranged:  (Per patient, has car seat in room for discharge with infant.)   Social Drivers of Health (SDOH) Interventions SDOH Screenings   Food Insecurity: No Food Insecurity (06/19/2024)  Housing: Low Risk  (06/19/2024)  Transportation Needs: No Transportation Needs (06/19/2024)  Utilities: Not At Risk (06/19/2024)  Depression (PHQ2-9): Low Risk  (09/14/2022)  Tobacco Use: Low Risk  (06/19/2024)  Recent Concern: Tobacco Use - Medium Risk (06/12/2024)   Received from [facility]     Readmission Risk Interventions     No data to display

## 2024-06-21 NOTE — Discharge Instructions (Signed)

## 2024-06-23 ENCOUNTER — Encounter (HOSPITAL_COMMUNITY): Payer: Self-pay

## 2024-06-24 NOTE — Lactation Note (Signed)
 This note was copied from a baby's chart. Lactation Consultation Note  Patient Name: [facility] Today's Date: 06/24/2024 Age:23 years Reason for consult: Initial assessment;Mother's request;MD order;Primapara;1st time breastfeeding;Term;Breastfeeding assistance  P1- MD requested for the [facility] to see this pt due to possible feeding concerns from MOB. LC first performed an oral assessment on infant and noted chomping on this LC's gloved finger. Per MOB, infant usually nurses for about 9 minutes (average) at a time, always with a nipple shield. MOB sometimes supplements her breast milk with a syringe only because she is afraid of nipple confusion. LC encouraged the discontinuation of the syringe and encouraged her to use a bottle instead. LC reviewed how to pace feed infant. LC reviewed how at 23 days of life, infant should be nursing for longer periods than 9 minutes with every feeding. LC assessed MOB's breasts and noted that they were firm at this time due to how full they were. LC does not believe that MOB needs a nipple shield, if she continues to work with infant. With MOB's permission, LC reviewed how to latch infant without a shield. Infant was too upset to latch at this time without the shield. LC encouraged MOB to first attempt without the shield for a maximum of 5 minutes with every feeding.   To see how much infant is about to transfer, we attempted to do a weighted feeding. When LC weighed infant before the feeding, she weighed 3.300 kg. LC then assisted with placing infant on the right breast in the cross cradle position. LC had MOB demonstrate how she uses the nipple shield. MOB just placed the shield on her breast and held it. LC reviewed how to properly place to to ensure she is transferring milk. LC demonstrated how to turn the shield half way inside out, then press around the shield to suction the nipple into the shield. MOB reported being shocked that no one had taught her this when she  delivered. MOB noted a difference in the way the shield felt and the way it felt when infant was nursing. Infant was able to latch immediately with the shield and nursed for 12 minutes. Infant was active and rhythmic when nursing on the breast. LC demonstrated how to flange infant's lips as well. When infant was done nursing, LC weighed her again and she weighed 3.445 kg. Per the milk transfer formula, it showed that infant drank a total of 4.9 ounces. Both FOB and LC calculated this a few times. LC ensured MOB that LC does believe that infant was transferring milk, but definitely not 4.9 ounces at this time. Both MOB and FOB agreed. LC believes that the scale may have been off with it's weights. LC notified the MD about all of this.   LC reviewed MOB's flange size, engorgement/breast care, LC services handout, CDC milk storage guidelines and how to properly place the shield. LC encouraged MOB to call for further assistance as needed.  Maternal Data Has patient been taught Hand Expression?: Yes Does the patient have breastfeeding experience prior to this delivery?: No  Feeding Mother's Current Feeding Choice: Breast Milk  LATCH Score Latch: Grasps breast easily, tongue down, lips flanged, rhythmical sucking.  Audible Swallowing: Spontaneous and intermittent  Type of Nipple: Everted at rest and after stimulation  Comfort (Breast/Nipple): Soft / non-tender  Hold (Positioning): Assistance needed to correctly position infant at breast and maintain latch.  LATCH Score: 9   Lactation Tools Discussed/Used Tools: Pump;Flanges Nipple shield size: 20 Flange  Size:  (16 mm flange) Breast pump type: Manual Pump Education: Setup, frequency, and cleaning;Milk Storage Pumped volume:  (between 2-3 ounces at a time after breastfeeding)  Interventions Interventions: Breast feeding basics reviewed;Assisted with latch;Breast compression;Adjust position;Support pillows;Position options;Expressed  milk;Hand pump;Education;LC Services brochure  Discharge Discharge Education: Engorgement and breast care;Warning signs for feeding baby Pump: DEBP;Manual;Hands Free;Personal  Consult Status Consult Status: PRN Date: 06/24/24    Recardo Hoit BS, IBCLC 06/24/2024, 10:11 PM

## 2024-08-11 ENCOUNTER — Ambulatory Visit: Admitting: Cardiology

## 2024-08-23 DIAGNOSIS — Z1332 Encounter for screening for maternal depression: Secondary | ICD-10-CM | POA: Diagnosis not present

## 2024-08-23 DIAGNOSIS — Z30011 Encounter for initial prescription of contraceptive pills: Secondary | ICD-10-CM | POA: Diagnosis not present
